# Patient Record
Sex: Female | Born: 1935 | Race: White | Hispanic: No | Marital: Married | Smoking: Former smoker
Health system: Southern US, Community
[De-identification: ages and names within clinical notes are randomized; demographics above are authoritative.]

## PROBLEM LIST (undated history)

## (undated) DIAGNOSIS — I10 Essential (primary) hypertension: Secondary | ICD-10-CM

## (undated) DIAGNOSIS — E785 Hyperlipidemia, unspecified: Secondary | ICD-10-CM

## (undated) DIAGNOSIS — I219 Acute myocardial infarction, unspecified: Secondary | ICD-10-CM

## (undated) HISTORY — PX: CARDIAC CATHETERIZATION: SHX172

## (undated) HISTORY — PX: CORONARY ANGIOPLASTY: SHX604

---

## 1993-02-25 HISTORY — PX: CORONARY STENT PLACEMENT: SHX1402

## 2000-09-03 ENCOUNTER — Inpatient Hospital Stay (HOSPITAL_COMMUNITY)
Admission: RE | Admit: 2000-09-03 | Discharge: 2000-09-16 | Payer: Self-pay | Admitting: Physical Medicine & Rehabilitation

## 2004-08-21 ENCOUNTER — Ambulatory Visit: Payer: Self-pay | Admitting: Ophthalmology

## 2004-08-27 ENCOUNTER — Ambulatory Visit: Payer: Self-pay | Admitting: Ophthalmology

## 2011-06-09 ENCOUNTER — Emergency Department: Payer: Self-pay | Admitting: Emergency Medicine

## 2011-06-09 LAB — CBC
MCH: 30.5 pg (ref 26.0–34.0)
MCV: 94 fL (ref 80–100)
Platelet: 275 10*3/uL (ref 150–440)
RDW: 13.7 % (ref 11.5–14.5)

## 2011-06-09 LAB — COMPREHENSIVE METABOLIC PANEL
Alkaline Phosphatase: 56 U/L (ref 50–136)
Anion Gap: 14 (ref 7–16)
EGFR (African American): 37 — ABNORMAL LOW
Osmolality: 290 (ref 275–301)
SGOT(AST): 36 U/L (ref 15–37)
SGPT (ALT): 17 U/L
Total Protein: 7.7 g/dL (ref 6.4–8.2)

## 2011-06-13 ENCOUNTER — Emergency Department: Payer: Self-pay | Admitting: Emergency Medicine

## 2011-06-13 LAB — COMPREHENSIVE METABOLIC PANEL
Anion Gap: 10 (ref 7–16)
Bilirubin,Total: 0.6 mg/dL (ref 0.2–1.0)
Chloride: 104 mmol/L (ref 98–107)
Co2: 26 mmol/L (ref 21–32)
Creatinine: 1.51 mg/dL — ABNORMAL HIGH (ref 0.60–1.30)
EGFR (African American): 36 — ABNORMAL LOW
EGFR (Non-African Amer.): 31 — ABNORMAL LOW
Osmolality: 284 (ref 275–301)
SGPT (ALT): 21 U/L
Sodium: 140 mmol/L (ref 136–145)

## 2011-06-13 LAB — CBC WITH DIFFERENTIAL/PLATELET
Basophil #: 0.1 10*3/uL (ref 0.0–0.1)
Eosinophil #: 0 10*3/uL (ref 0.0–0.7)
Eosinophil %: 0.2 %
HCT: 36.3 % (ref 35.0–47.0)
Monocyte %: 11.9 %
Platelet: 287 10*3/uL (ref 150–440)
RBC: 3.81 10*6/uL (ref 3.80–5.20)

## 2011-06-15 ENCOUNTER — Inpatient Hospital Stay: Payer: Self-pay | Admitting: Internal Medicine

## 2011-06-15 LAB — BASIC METABOLIC PANEL
Calcium, Total: 9.5 mg/dL (ref 8.5–10.1)
Chloride: 102 mmol/L (ref 98–107)
Co2: 21 mmol/L (ref 21–32)
Osmolality: 279 (ref 275–301)
Potassium: 3.4 mmol/L — ABNORMAL LOW (ref 3.5–5.1)

## 2011-06-15 LAB — CBC
MCH: 30.2 pg (ref 26.0–34.0)
MCHC: 32 g/dL (ref 32.0–36.0)
Platelet: 280 10*3/uL (ref 150–440)
RBC: 3.63 10*6/uL — ABNORMAL LOW (ref 3.80–5.20)
RDW: 13.5 % (ref 11.5–14.5)
WBC: 10.8 10*3/uL (ref 3.6–11.0)

## 2011-06-15 LAB — TROPONIN I
Troponin-I: 2.3 ng/mL — ABNORMAL HIGH
Troponin-I: 1.4 ng/mL — ABNORMAL HIGH

## 2011-06-15 LAB — TSH: Thyroid Stimulating Horm: 2.79 u[IU]/mL

## 2011-06-15 LAB — CK TOTAL AND CKMB (NOT AT ARMC): CK, Total: 102 U/L (ref 21–215)

## 2011-06-16 LAB — COMPREHENSIVE METABOLIC PANEL
Albumin: 2.8 g/dL — ABNORMAL LOW (ref 3.4–5.0)
Alkaline Phosphatase: 57 U/L (ref 50–136)
Anion Gap: 10 (ref 7–16)
Calcium, Total: 8.7 mg/dL (ref 8.5–10.1)
EGFR (African American): 31 — ABNORMAL LOW
Glucose: 92 mg/dL (ref 65–99)
Osmolality: 279 (ref 275–301)
SGOT(AST): 35 U/L (ref 15–37)
SGPT (ALT): 14 U/L
Sodium: 139 mmol/L (ref 136–145)
Total Protein: 6.5 g/dL (ref 6.4–8.2)

## 2011-06-16 LAB — CBC WITH DIFFERENTIAL/PLATELET
Basophil %: 0.6 %
Eosinophil %: 0.7 %
HGB: 9.9 g/dL — ABNORMAL LOW (ref 12.0–16.0)
Lymphocyte #: 1.3 10*3/uL (ref 1.0–3.6)
Lymphocyte %: 13.7 %
MCH: 29.8 pg (ref 26.0–34.0)
MCHC: 31.3 g/dL — ABNORMAL LOW (ref 32.0–36.0)
MCV: 95 fL (ref 80–100)
Neutrophil #: 6.8 10*3/uL — ABNORMAL HIGH (ref 1.4–6.5)
Neutrophil %: 74.4 %
Platelet: 302 10*3/uL (ref 150–440)
RBC: 3.34 10*6/uL — ABNORMAL LOW (ref 3.80–5.20)
RDW: 13.2 % (ref 11.5–14.5)
WBC: 9.2 10*3/uL (ref 3.6–11.0)

## 2011-06-17 LAB — BASIC METABOLIC PANEL
Anion Gap: 9 (ref 7–16)
BUN: 15 mg/dL (ref 7–18)
Calcium, Total: 8.6 mg/dL (ref 8.5–10.1)
Co2: 21 mmol/L (ref 21–32)
EGFR (African American): 37 — ABNORMAL LOW
Glucose: 94 mg/dL (ref 65–99)
Osmolality: 282 (ref 275–301)

## 2011-07-01 ENCOUNTER — Encounter (HOSPITAL_COMMUNITY): Payer: Self-pay | Admitting: *Deleted

## 2011-07-01 ENCOUNTER — Emergency Department (HOSPITAL_COMMUNITY): Payer: Medicare Other

## 2011-07-01 ENCOUNTER — Observation Stay (HOSPITAL_COMMUNITY)
Admission: EM | Admit: 2011-07-01 | Discharge: 2011-07-04 | Disposition: A | Discharge status: Home / Self Care | Payer: Medicare Other | Attending: Internal Medicine | Admitting: Internal Medicine

## 2011-07-01 DIAGNOSIS — G459 Transient cerebral ischemic attack, unspecified: Secondary | ICD-10-CM

## 2011-07-01 DIAGNOSIS — E782 Mixed hyperlipidemia: Secondary | ICD-10-CM

## 2011-07-01 DIAGNOSIS — N289 Disorder of kidney and ureter, unspecified: Secondary | ICD-10-CM | POA: Insufficient documentation

## 2011-07-01 DIAGNOSIS — F05 Delirium due to known physiological condition: Secondary | ICD-10-CM | POA: Insufficient documentation

## 2011-07-01 DIAGNOSIS — Z8669 Personal history of other diseases of the nervous system and sense organs: Secondary | ICD-10-CM

## 2011-07-01 DIAGNOSIS — I10 Essential (primary) hypertension: Secondary | ICD-10-CM | POA: Diagnosis present

## 2011-07-01 DIAGNOSIS — F0390 Unspecified dementia without behavioral disturbance: Secondary | ICD-10-CM | POA: Insufficient documentation

## 2011-07-01 DIAGNOSIS — I252 Old myocardial infarction: Secondary | ICD-10-CM | POA: Insufficient documentation

## 2011-07-01 DIAGNOSIS — R4789 Other speech disturbances: Principal | ICD-10-CM | POA: Insufficient documentation

## 2011-07-01 DIAGNOSIS — M216X9 Other acquired deformities of unspecified foot: Secondary | ICD-10-CM | POA: Insufficient documentation

## 2011-07-01 DIAGNOSIS — G5790 Unspecified mononeuropathy of unspecified lower limb: Secondary | ICD-10-CM | POA: Insufficient documentation

## 2011-07-01 DIAGNOSIS — E785 Hyperlipidemia, unspecified: Secondary | ICD-10-CM | POA: Diagnosis present

## 2011-07-01 DIAGNOSIS — R4781 Slurred speech: Secondary | ICD-10-CM

## 2011-07-01 DIAGNOSIS — I2510 Atherosclerotic heart disease of native coronary artery without angina pectoris: Secondary | ICD-10-CM | POA: Diagnosis present

## 2011-07-01 DIAGNOSIS — E876 Hypokalemia: Secondary | ICD-10-CM | POA: Insufficient documentation

## 2011-07-01 DIAGNOSIS — R4701 Aphasia: Secondary | ICD-10-CM

## 2011-07-01 DIAGNOSIS — E860 Dehydration: Secondary | ICD-10-CM | POA: Insufficient documentation

## 2011-07-01 HISTORY — DX: Acute myocardial infarction, unspecified: I21.9

## 2011-07-01 HISTORY — DX: Essential (primary) hypertension: I10

## 2011-07-01 HISTORY — DX: Hyperlipidemia, unspecified: E78.5

## 2011-07-01 LAB — PROTIME-INR
INR: 0.94 (ref 0.00–1.49)
Prothrombin Time: 12.8 seconds (ref 11.6–15.2)

## 2011-07-01 LAB — BASIC METABOLIC PANEL
GFR calc Af Amer: 30 mL/min — ABNORMAL LOW (ref >90)
GFR calc non Af Amer: 26 mL/min — ABNORMAL LOW (ref >90)
Potassium: 3.3 mEq/L — ABNORMAL LOW (ref 3.5–5.1)
Sodium: 137 mEq/L (ref 135–145)

## 2011-07-01 LAB — DIFFERENTIAL
Basophils Absolute: 0 10*3/uL (ref 0.0–0.1)
Basophils Relative: 0 % (ref 0–1)
Eosinophils Absolute: 0.1 10*3/uL (ref 0.0–0.7)
Lymphs Abs: 2.3 10*3/uL (ref 0.7–4.0)
Neutrophils Relative %: 60 % (ref 43–77)

## 2011-07-01 LAB — CBC
MCH: 30.5 pg (ref 26.0–34.0)
MCHC: 32.8 g/dL (ref 30.0–36.0)
Platelets: 301 10*3/uL (ref 150–400)
RBC: 3.7 MIL/uL — ABNORMAL LOW (ref 3.87–5.11)

## 2011-07-01 LAB — APTT: aPTT: 29 seconds (ref 24–37)

## 2011-07-01 MED ORDER — SODIUM CHLORIDE 0.9 % IV SOLN
INTRAVENOUS | Status: DC
  Administered 2011-07-02: 75 mL/h via INTRAVENOUS

## 2011-07-01 MED ORDER — ACETAMINOPHEN 325 MG PO TABS: 650.0000 mg | ORAL_TABLET | ORAL | Status: DC | PRN

## 2011-07-01 MED ORDER — POTASSIUM CHLORIDE CRYS ER 20 MEQ PO TBCR
20.0000 meq | EXTENDED_RELEASE_TABLET | Freq: Two times a day (BID) | ORAL | Status: DC
  Administered 2011-07-02 – 2011-07-03 (×3): 20 meq via ORAL
  Filled 2011-07-01 (×5): qty 1

## 2011-07-01 MED ORDER — SODIUM CHLORIDE 0.9 % IV SOLN
INTRAVENOUS | Status: AC
  Administered 2011-07-01: 125 mL/h via INTRAVENOUS

## 2011-07-01 MED ORDER — SIMVASTATIN 40 MG PO TABS
40.0000 mg | ORAL_TABLET | Freq: Every evening | ORAL | Status: DC
Start: 2011-07-02 — End: 2011-07-04
  Administered 2011-07-02 – 2011-07-03 (×2): 40 mg via ORAL
  Filled 2011-07-01 (×3): qty 1

## 2011-07-01 MED ORDER — PAROXETINE HCL 20 MG PO TABS
20.0000 mg | ORAL_TABLET | Freq: Every day | ORAL | Status: DC
Start: 2011-07-02 — End: 2011-07-04
  Administered 2011-07-02 – 2011-07-04 (×3): 20 mg via ORAL
  Filled 2011-07-01 (×3): qty 1

## 2011-07-01 MED ORDER — SODIUM CHLORIDE 0.9 % IV SOLN
INTRAVENOUS | Status: DC
  Administered 2011-07-01: 21:00:00 via INTRAVENOUS

## 2011-07-01 MED ORDER — ASPIRIN 325 MG PO TABS
325.0000 mg | ORAL_TABLET | Freq: Every day | ORAL | Status: DC
  Administered 2011-07-02: 325 mg via ORAL
  Filled 2011-07-01: qty 1

## 2011-07-01 MED ORDER — METOPROLOL SUCCINATE ER 50 MG PO TB24
50.0000 mg | ORAL_TABLET | Freq: Every day | ORAL | Status: DC
Start: 2011-07-02 — End: 2011-07-04
  Administered 2011-07-02 – 2011-07-04 (×3): 50 mg via ORAL
  Filled 2011-07-01 (×3): qty 1

## 2011-07-01 NOTE — Code Documentation (Signed)
78 yo female started to have stuttered/slurred speech and odd hand gestures noted by patient's husband at 1800, patient stated she had felt funny all day. EMS arrived and only slurred/stuttered speech was present, EMS noted that this resolved at 1845. Code stroke called at 1854, patient arrived at 54, EDP exam at 1859, stroke team arrived at 17, LSN per husband was 1800, patient arrived to CT at 1901, phlebotomy arrived at 1901, CT read by Dr. Roseanne Reno read CT at [phone], initial NIH 01, patient unable to state month but no issues with slurred or stuttered speech. CT canceled per Dr. Roseanne Reno at [phone].

## 2011-07-01 NOTE — ED Notes (Signed)
Went with pt to CT scan.  [place], RN from stroke team will chart NIH and stroke log.

## 2011-07-01 NOTE — ED Notes (Addendum)
Pt from home.  pts husband noticed that pt was grasping objects and having slurred speech at 1800 today.  Pt had slurred speech when EMS arrived. Speech cleared at 1845.  No other deficits.  No droop or drift noted.  Pt denies pain.  Vitals stable.  20ga (L) wrist, 20ga LAC.  CBG 106

## 2011-07-01 NOTE — ED Provider Notes (Signed)
History     CSN: [id number]  Arrival date & time 07/01/11  1859   First MD Initiated Contact with Patient 07/01/11 1906      Chief Complaint  Patient presents with  . Code Stroke    (Consider location/radiation/quality/duration/timing/severity/associated sxs/prior treatment) HPI  88yoF h/o HTN, HLD, CAD presents with difficulty talking. Her family approximately 6:10 PM patient began to have expressive aphasia. She had minimal slurred speech at that time. It lasted for 30-40 minutes. The patient states that she feels back to baseline the emergency department. Her family agrees. She denies numbness, tingling, weakness of her extremity. She denies head trauma. Her husband states that she was having some difficulty grasping objects earlier today but patient denies.  ED Notes, ED Provider Notes from 07/01/11 0000 to 07/01/11 19:20:23       Katelyn Irwin Brakeman, RN 07/01/2011 19:17      Went with pt to CT scan. [place], RN from stroke team will chart NIH and stroke log.         Felipa Evener Haw[place], [place] 07/01/2011 19:16      Pt from home. pts husband noticed that pt was grasping objects and having slurred speech at 1800 today. Pt had slurred speech when EMS arrived. Speech cleared at 1845. No other deficits. No droop or drift noted. Pt denies pain. Vitals stable. 20ga (L) wrist, 20ga LAC. CBG 106                 Original note by Vilma Meckel, RN at 07/01/2011 19:15         Katelyn Irwin Brakeman, RN 07/01/2011 19:15      Pt from home. pts husband noticed that pt was grasping objects and having slurred speech at 1800 today. Pt had slurred speech when EMS arrived. Speech cleared at 1845. No other deficits. No droop or drift noted. Pt denies pain. Vitals stable. 20ga (L) wrist, 20ga LAC.     Past Medical History  Diagnosis Date  . Hypertension   . Hyperlipemia   . Myocardial infarction     Past Surgical History  Procedure Date  . Coronary stent placement 1995  .  Cardiac catheterization     stent placed  . Coronary angioplasty     History reviewed. No pertinent family history.  History  Substance Use Topics  . Smoking status: Former Games developer  . Smokeless tobacco: Not on file  . Alcohol Use: 0.6 oz/week    1 Glasses of wine per week     5 oz a night    OB History    Grav Para Term Preterm Abortions TAB SAB Ect Mult Living                  Review of Systems  All other systems reviewed and are negative.   except as noted HPI   Allergies  Penicillins  Home Medications  No current outpatient prescriptions on file.  BP 162/73  Pulse 68  Temp(Src) 98.1 F (36.7 C) (Oral)  Resp 20  Ht 5\' 6"  (1.676 m)  Wt 118 lb 2.7 oz (53.6 kg)  BMI 19.07 kg/m2  SpO2 97%  Physical Exam  Nursing note and vitals reviewed. Constitutional: She is oriented to person, place, and time. She appears well-developed.  HENT:  Head: Atraumatic.  Mouth/Throat: Oropharynx is clear and moist.  Eyes: Conjunctivae and EOM are normal. Pupils are equal, round, and reactive to light.  Neck: Normal range of motion. Neck supple.  Cardiovascular: Normal rate,  regular rhythm, normal heart sounds and intact distal pulses.   Pulmonary/Chest: Effort normal and breath sounds normal. No respiratory distress. She has no wheezes. She has no rales.  Abdominal: Soft. She exhibits no distension. There is no tenderness. There is no rebound and no guarding.  Musculoskeletal: Normal range of motion.  Neurological: She is alert and oriented to person, place, and time. No cranial nerve deficit. She exhibits normal muscle tone. Coordination normal.       Strength 5/5 all extremities No pronator drift No facial droop   Skin: Skin is warm and dry. No rash noted.  Psychiatric: She has a normal mood and affect.    Date: 07/02/2011  Rate: 68  Rhythm: normal sinus rhythm  QRS Axis: normal  Intervals: normal  ST/T Wave abnormalities: nonspecific ST changes  Conduction  Disutrbances:nonspecific intraventricular conduction delay  Narrative Interpretation:   Old EKG Reviewed: none available per ED staff.  ED Course  Procedures (including critical care time)  Labs Reviewed  CBC - Abnormal; Notable for the following:    RBC 3.70 (*)    Hemoglobin 11.3 (*)    HCT 34.4 (*)    All other components within normal limits  BASIC METABOLIC PANEL - Abnormal; Notable for the following:    Potassium 3.3 (*)    Glucose, Bld 114 (*)    Creatinine, Ser 1.69 (*)    GFR calc non Af Amer 26 (*)    GFR calc Af Amer 30 (*)    All other components within normal limits  DIFFERENTIAL  APTT  PROTIME-INR  GLUCOSE, CAPILLARY  COMPREHENSIVE METABOLIC PANEL  CBC  TSH  GLUCOSE, POCT (MANUAL RESULT ENTRY)  URINE RAPID DRUG SCREEN (HOSP PERFORMED)  HEMOGLOBIN A1C  LIPID PANEL  GLUCOSE, POCT (MANUAL RESULT ENTRY)  GLUCOSE, POCT (MANUAL RESULT ENTRY)  GLUCOSE, POCT (MANUAL RESULT ENTRY)   Ct Head Wo Contrast  07/01/2011  *RADIOLOGY REPORT*  Clinical Data: Code stroke.  Slurred speech.  CT HEAD WITHOUT CONTRAST  Technique:  Contiguous axial images were obtained from the base of the skull through the vertex without contrast.  Comparison: None.  Findings: Study is degraded by patient motion despite repeated imaging.  Within the motion artifact, no acute hemorrhage, hydrocephalus, mass lesion, abnormal extra-axial fluid collection is evident.  There is no CT evidence for acute ischemia.  Diffuse loss of parenchymal volume is consistent with atrophy. Patchy low attenuation in the deep hemispheric and periventricular white matter is nonspecific, but likely reflects chronic microvascular ischemic demyelination.  The visualized paranasal sinuses and mastoid air cells are clear.  IMPRESSION: No CT evidence for acute intracranial abnormality although study is degraded by patient motion.  Atrophy with advanced chronic small vessel ischemic white matter demyelination.  I personally called the  results of the study directly to Dr. Bettey Costa, covering for Dr. Roseanne Reno at this time, at 1917 hours on 07/01/2011.  Original Report Authenticated By: ERIC A. MANSELL, M.D.   1. Expressive aphasia   2. Slurred speech   Renal insufficiency  MDM  Transient expressive aphasia/slurred speech. Resolved. She is not a tpa candidate. CODE STROKE cancelled. See neurology note for full details. Discussed admission with [facility]. She will be admitted for further w/u and evaluation.         Forbes Cellar, MD 07/02/11 [phone]

## 2011-07-01 NOTE — H&P (Signed)
[name] is an 78 y.o. female.   PCP - Dr.Hande in [place]. Chief Complaint: Difficulty speaking. HPI: 78 year-old female with known history of hypertension, CAD status post stenting in 1995, hyperlipidemia and neuropathy with foot drop for which patient had received immunoglobulin in 2002 at [facility] was brought to the ER as patient was noticed to have slurred speech with confusion. As per patient's husband patient's symptoms were noticed around 6 PM and lasted for around 10 minutes and the symptoms completely resolved. Patient did not have any focal deficits or did not lose consciousness. Patient did not have any visual symptoms. In the ER patient had a CT head and initially a code stroke was called. Which was canceled later by the neurologist. At this time patient has been admitted for TIA workup. Patient denies any chest pain shortness of breath nausea vomiting abdominal pain. Presently she has no difficulty speaking.  Past Medical History  Diagnosis Date  . Hypertension   . Hyperlipemia   . Myocardial infarction     Past Surgical History  Procedure Date  . Coronary stent placement 1995  . Cardiac catheterization     stent placed  . Coronary angioplasty     History reviewed. No pertinent family history. Social History:  reports that she has quit smoking. She does not have any smokeless tobacco history on file. She reports that she drinks about .6 ounces of alcohol per week. Her drug history not on file.  Allergies:  Allergies  Allergen Reactions  . Penicillins Rash    Medications Prior to Admission  Medication Sig Dispense Refill  . hydrochlorothiazide (HYDRODIURIL) 25 MG tablet Take 25 mg by mouth daily.      . metoprolol succinate (TOPROL-XL) 25 MG 24 hr tablet Take 50 mg by mouth daily.      Marland Kitchen PARoxetine (PAXIL) 20 MG tablet Take 20 mg by mouth every morning.      . simvastatin (ZOCOR) 40 MG tablet Take 40 mg by mouth every evening.        Results for orders placed  during the hospital encounter of 07/01/11 (from the past 48 hour(s))  CBC     Status: Abnormal   Collection Time   07/01/11  7:04 PM      Component Value Range Comment   WBC 7.3  4.0 - 10.5 (K/uL)    RBC 3.70 (*) 3.87 - 5.11 (MIL/uL)    Hemoglobin 11.3 (*) 12.0 - 15.0 (g/dL)    HCT 40.9 (*) 81.1 - 46.0 (%)    MCV 93.0  78.0 - 100.0 (fL)    MCH 30.5  26.0 - 34.0 (pg)    MCHC 32.8  30.0 - 36.0 (g/dL)    RDW 91.4  78.2 - 95.6 (%)    Platelets 301  150 - 400 (K/uL)   DIFFERENTIAL     Status: Normal   Collection Time   07/01/11  7:04 PM      Component Value Range Comment   Neutrophils Relative 60  43 - 77 (%)    Neutro Abs 4.4  1.7 - 7.7 (K/uL)    Lymphocytes Relative 31  12 - 46 (%)    Lymphs Abs 2.3  0.7 - 4.0 (K/uL)    Monocytes Relative 7  3 - 12 (%)    Monocytes Absolute 0.5  0.1 - 1.0 (K/uL)    Eosinophils Relative 1  0 - 5 (%)    Eosinophils Absolute 0.1  0.0 - 0.7 (K/uL)  Basophils Relative 0  0 - 1 (%)    Basophils Absolute 0.0  0.0 - 0.1 (K/uL)   BASIC METABOLIC PANEL     Status: Abnormal   Collection Time   07/01/11  7:04 PM      Component Value Range Comment   Sodium 137  135 - 145 (mEq/L)    Potassium 3.3 (*) 3.5 - 5.1 (mEq/L)    Chloride 99  96 - 112 (mEq/L)    CO2 20  19 - 32 (mEq/L)    Glucose, Bld 114 (*) 70 - 99 (mg/dL)    BUN 20  6 - 23 (mg/dL)    Creatinine, Ser 1.61 (*) 0.50 - 1.10 (mg/dL)    Calcium 09.6  8.4 - 10.5 (mg/dL)    GFR calc non Af Amer 26 (*) >90 (mL/min)    GFR calc Af Amer 30 (*) >90 (mL/min)   APTT     Status: Normal   Collection Time   07/01/11  7:04 PM      Component Value Range Comment   aPTT 29  24 - 37 (seconds)   PROTIME-INR     Status: Normal   Collection Time   07/01/11  7:04 PM      Component Value Range Comment   Prothrombin Time 12.8  11.6 - 15.2 (seconds)    INR 0.94  0.00 - 1.49    GLUCOSE, CAPILLARY     Status: Normal   Collection Time   07/01/11  8:27 PM      Component Value Range Comment   Glucose-Capillary 99  70 - 99  (mg/dL)    Comment 1 Notify RN      Ct Head Wo Contrast  07/01/2011  *RADIOLOGY REPORT*  Clinical Data: Code stroke.  Slurred speech.  CT HEAD WITHOUT CONTRAST  Technique:  Contiguous axial images were obtained from the base of the skull through the vertex without contrast.  Comparison: None.  Findings: Study is degraded by patient motion despite repeated imaging.  Within the motion artifact, no acute hemorrhage, hydrocephalus, mass lesion, abnormal extra-axial fluid collection is evident.  There is no CT evidence for acute ischemia.  Diffuse loss of parenchymal volume is consistent with atrophy. Patchy low attenuation in the deep hemispheric and periventricular white matter is nonspecific, but likely reflects chronic microvascular ischemic demyelination.  The visualized paranasal sinuses and mastoid air cells are clear.  IMPRESSION: No CT evidence for acute intracranial abnormality although study is degraded by patient motion.  Atrophy with advanced chronic small vessel ischemic white matter demyelination.  I personally called the results of the study directly to Dr. Bettey Costa, covering for Dr. Roseanne Reno at this time, at 1917 hours on 07/01/2011.  Original Report Authenticated By: ERIC A. MANSELL, M.D.    Review of Systems  Constitutional: Negative.   HENT: Negative.   Eyes: Negative.   Respiratory: Negative.   Cardiovascular: Negative.   Gastrointestinal: Negative.   Genitourinary: Negative.   Musculoskeletal: Negative.   Skin: Negative.   Neurological: Positive for speech change.       Confusion.  Endo/Heme/Allergies: Negative.   Psychiatric/Behavioral: Negative.     Blood pressure 162/73, pulse 68, temperature 98.1 F (36.7 C), temperature source Oral, resp. rate 20, height 5\' 6"  (1.676 m), weight 53.6 kg (118 lb 2.7 oz), SpO2 97.00%. Physical Exam  Constitutional: She is oriented to person, place, and time. She appears well-developed and well-nourished. No distress.  HENT:  Head:  Normocephalic and atraumatic.  Right Ear: External ear  normal.  Left Ear: External ear normal.  Mouth/Throat: No oropharyngeal exudate.  Eyes: Conjunctivae are normal. Right eye exhibits no discharge. Left eye exhibits no discharge. No scleral icterus.  Neck: Normal range of motion. Neck supple.  Cardiovascular: Normal rate, regular rhythm and normal heart sounds.   Respiratory: Effort normal and breath sounds normal. No respiratory distress. She has no wheezes. She has no rales.  GI: Soft. Bowel sounds are normal. She exhibits no distension. There is no tenderness. There is no rebound.  Musculoskeletal: Normal range of motion. She exhibits no edema and no tenderness.  Neurological: She is alert and oriented to person, place, and time.       Moves all extremities 5/5. No facial asymmetry.  Skin: Skin is warm and dry. No rash noted. She is not diaphoretic. No erythema.  Psychiatric: Her behavior is normal.     Assessment/Plan #1. Transient episode of difficulty speaking and confusion possibly TIA - place patient on neuro checks, swallow evaluation. Get MRI/MRA brain, carotid Doppler and 2-D echo. #2. History of CAD status post stenting - presently has no chest pain. Continue home medications. #3. History of neuropathy with foot drop status post IV immunoglobulin in 2002. #4. History of hypertension and hyperlipidemia - continue present medications. #5. Mild hypokalemia - replace and recheck.  CODE STATUS - DO NOT RESUSCITATE as confirmed with patient's husband.  Jeneal Vogl N. 07/01/2011, 11:40 PM

## 2011-07-01 NOTE — Consult Note (Signed)
Chief Complaint: Transient speech output difficulty.  HPI: [name] is an 78 y.o. female a history of hypertension, coronary artery disease and hyperlipidemia, presenting with onset of speech output difficulty at 1800 today. No focal weakness was noted. No facial asymmetry was noted. There was no apparent dysarthria. The previous history of stroke or TIA. She's been on aspirin 81 mg per day. CT scan of her head showed no acute intracranial abnormality. Patient's speech abnormality resolved after arriving in the emergency room. NIH stroke score initially was 2. Following improvement NIH stroke score was 0. Patient was not given intravenous thrombolytic therapy with TPA, and code stroke status was canceled.  LSN: 1800 on 07/01/2011 tPA Given: No: Rapid improvement in symptoms MRankin: 0  No past medical history on file.  No family history on file.   Medications: Prior to Admission:  Hydrochlorothiazide 25 mg per day Toprol-XL 50 mg per day Paxil 20 mg per day Zocor 40 mg per day Aspirin 81 mg per day  Physical Examination: Blood pressure 143/61, pulse 73, temperature 97.5 F (36.4 C), temperature source Oral, resp. rate 18, SpO2 99.00%.  Neurologic Examination: Mental Status: Alert, oriented, thought content appropriate.  Speech fluent without evidence of aphasia. Able to follow commands. Cranial Nerves: II-Visual fields were normal. III/IV/VI-Pupils were equal and reacted. Extraocular movements were full and conjugate.    V/VII-no facial numbness and no facial weakness. VIII-normal. X-normal speech. XII-midline tongue extension Motor: Bilateral weakness of dorsiflexors of the feet (3/5), as well as mild to moderate intrinsic hand muscle weakness bilaterally with wasting, right greater than left. Motor exam is otherwise unremarkable. Deep Tendon Reflexes: 2+ and symmetric except for absent ankle reflexes bilaterally. Plantars: Flexor bilaterally Cerebellar: Normal  finger-to-nose testing. Carotid auscultation: Normal   Ct Head Wo Contrast  07/01/2011  *RADIOLOGY REPORT*  Clinical Data: Code stroke.  Slurred speech.  CT HEAD WITHOUT CONTRAST  Technique:  Contiguous axial images were obtained from the base of the skull through the vertex without contrast.  Comparison: None.  Findings: Study is degraded by patient motion despite repeated imaging.  Within the motion artifact, no acute hemorrhage, hydrocephalus, mass lesion, abnormal extra-axial fluid collection is evident.  There is no CT evidence for acute ischemia.  Diffuse loss of parenchymal volume is consistent with atrophy. Patchy low attenuation in the deep hemispheric and periventricular white matter is nonspecific, but likely reflects chronic microvascular ischemic demyelination.  The visualized paranasal sinuses and mastoid air cells are clear.  IMPRESSION: No CT evidence for acute intracranial abnormality although study is degraded by patient motion.  Atrophy with advanced chronic small vessel ischemic white matter demyelination.  I personally called the results of the study directly to Dr. Bettey Costa, covering for Dr. Roseanne Reno at this time, at 1917 hours on 07/01/2011.  Original Report Authenticated By: ERIC A. MANSELL, M.D.    Assessment:   1. Probable transient ischemic attack, most likely subcortical involvement left MCA territory with transient expressive aphasia. 2. Chronic peripheral neuropathy with bilateral foot drop and bilateral intrinsic hand muscle weakness and wasting. Patient has been evaluated and is being followed by a neurologist in [facility].  Stroke Risk Factors - hyperlipidemia, hypertension.  Plan: 1. HgbA1c, fasting lipid panel 2. MRI, MRA  of the brain without contrast 3. PT consult, OT consult, Speech consult 4. Echocardiogram 5. Carotid dopplers 6. Prophylactic therapy-Antiplatelet med: Plavix 75 mg per day 7. Risk factor modification 8. Telemetry  monitoring  C.R. Roseanne Reno, MD [facility] [phone]  07/01/2011, 8:06  PM

## 2011-07-02 ENCOUNTER — Observation Stay (HOSPITAL_COMMUNITY): Payer: Medicare Other

## 2011-07-02 DIAGNOSIS — I2510 Atherosclerotic heart disease of native coronary artery without angina pectoris: Secondary | ICD-10-CM

## 2011-07-02 DIAGNOSIS — G459 Transient cerebral ischemic attack, unspecified: Secondary | ICD-10-CM

## 2011-07-02 DIAGNOSIS — E782 Mixed hyperlipidemia: Secondary | ICD-10-CM

## 2011-07-02 DIAGNOSIS — I10 Essential (primary) hypertension: Secondary | ICD-10-CM

## 2011-07-02 LAB — COMPREHENSIVE METABOLIC PANEL
Albumin: 3.1 g/dL — ABNORMAL LOW (ref 3.5–5.2)
Alkaline Phosphatase: 56 U/L (ref 39–117)
BUN: 20 mg/dL (ref 6–23)
Calcium: 9.4 mg/dL (ref 8.4–10.5)
Potassium: 4.7 mEq/L (ref 3.5–5.1)
Total Protein: 6.4 g/dL (ref 6.0–8.3)

## 2011-07-02 LAB — CBC
HCT: 31.7 % — ABNORMAL LOW (ref 36.0–46.0)
Hemoglobin: 10.1 g/dL — ABNORMAL LOW (ref 12.0–15.0)
MCV: 93.5 fL (ref 78.0–100.0)
RBC: 3.39 MIL/uL — ABNORMAL LOW (ref 3.87–5.11)
RDW: 13.3 % (ref 11.5–15.5)
WBC: 7 10*3/uL (ref 4.0–10.5)

## 2011-07-02 LAB — POCT I-STAT, CHEM 8
Chloride: 105 mEq/L (ref 96–112)
Glucose, Bld: 117 mg/dL — ABNORMAL HIGH (ref 70–99)
HCT: 35 % — ABNORMAL LOW (ref 36.0–46.0)
Hemoglobin: 11.9 g/dL — ABNORMAL LOW (ref 12.0–15.0)
Potassium: 3.3 mEq/L — ABNORMAL LOW (ref 3.5–5.1)
Sodium: 139 mEq/L (ref 135–145)

## 2011-07-02 LAB — RAPID URINE DRUG SCREEN, HOSP PERFORMED
Amphetamines: NOT DETECTED
Barbiturates: NOT DETECTED
Benzodiazepines: NOT DETECTED
Cocaine: NOT DETECTED
Opiates: NOT DETECTED
Tetrahydrocannabinol: NOT DETECTED

## 2011-07-02 LAB — LIPID PANEL
Cholesterol: 173 mg/dL (ref 0–200)
LDL Cholesterol: 102 mg/dL — ABNORMAL HIGH (ref 0–99)
Total CHOL/HDL Ratio: 3.5 RATIO
VLDL: 22 mg/dL (ref 0–40)

## 2011-07-02 MED ORDER — CLOPIDOGREL BISULFATE 75 MG PO TABS
75.0000 mg | ORAL_TABLET | Freq: Every day | ORAL | Status: DC
  Administered 2011-07-03 – 2011-07-04 (×2): 75 mg via ORAL
  Filled 2011-07-02 (×2): qty 1

## 2011-07-02 NOTE — Progress Notes (Signed)
  Echocardiogram 2D Echocardiogram has been performed.  [name] [facility] 07/02/2011, 10:08 AM

## 2011-07-02 NOTE — Progress Notes (Signed)
RN called by radiology with results of MRI scan. MD Suanne Marker and MD Pearlean Brownie paged with results. No orders at this time.   Minor, [name]

## 2011-07-02 NOTE — Progress Notes (Signed)
Subjective: Pt denies any c/o, states foot drop is unchanged-longstanding. Objective: Vital signs in last 24 hours: Temp:  [97.5 F (36.4 C)-98.7 F (37.1 C)] 98.3 F (36.8 C) (05/07 1423) Pulse Rate:  [64-75] 73  (05/07 1423) Resp:  [17-20] 20  (05/07 1423) BP: (131-162)/(61-74) 131/69 mmHg (05/07 1423) SpO2:  [94 %-100 %] 97 % (05/07 1423) Weight:  [53.6 kg (118 lb 2.7 oz)] 53.6 kg (118 lb 2.7 oz) (05/06 2310)   Intake/Output from previous day: 05/06 0701 - 05/07 0700 In: 462.5 [I.V.:462.5] Out: -  Intake/Output this shift: Total I/O In: 360 [P.O.:360] Out: -     General Appearance:    Alert, cooperative, no distress, appears stated age  Lungs:     Clear to auscultation bilaterally, respirations unlabored   Heart:    Regular rate and rhythm, S1 and S2 normal, no murmur, rub   or gallop  Abdomen:     Soft, non-tender, bowel sounds present,    no masses, no organomegaly  Extremities:    no cyanosis or edema  Neurologic:   CNII-XII intact, decreased dorsiflexion of feet bil., sensory grossly intact      Weight change:   Intake/Output Summary (Last 24 hours) at 07/02/11 1727 Last data filed at 07/02/11 0847  Gross per 24 hour  Intake  822.5 ml  Output      0 ml  Net  822.5 ml    Lab Results:   Basename 07/02/11 0505 07/01/11 1913 07/01/11 1904  NA 138 139 --  K 4.7 3.3* --  CL 105 105 --  CO2 24 -- 20  GLUCOSE 101* 117* --  BUN 20 20 --  CREATININE 1.74* 1.90* --  CALCIUM 9.4 -- 10.1    Basename 07/02/11 0505 07/01/11 1913 07/01/11 1904  WBC 7.0 -- 7.3  HGB 10.1* 11.9* --  HCT 31.7* 35.0* --  PLT 280 -- 301  MCV 93.5 -- 93.0   PT/INR  Basename 07/01/11 1904  LABPROT 12.8  INR 0.94   ABG No results found for this basename: PHART:2,PCO2:2,PO2:2,HCO3:2 in the last 72 hours  Micro Results: No results found for this or any previous visit (from the past 240 hour(s)). Studies/Results: Ct Head Wo Contrast  07/01/2011  *RADIOLOGY REPORT*  Clinical  Data: Code stroke.  Slurred speech.  CT HEAD WITHOUT CONTRAST  Technique:  Contiguous axial images were obtained from the base of the skull through the vertex without contrast.  Comparison: None.  Findings: Study is degraded by patient motion despite repeated imaging.  Within the motion artifact, no acute hemorrhage, hydrocephalus, mass lesion, abnormal extra-axial fluid collection is evident.  There is no CT evidence for acute ischemia.  Diffuse loss of parenchymal volume is consistent with atrophy. Patchy low attenuation in the deep hemispheric and periventricular white matter is nonspecific, but likely reflects chronic microvascular ischemic demyelination.  The visualized paranasal sinuses and mastoid air cells are clear.  IMPRESSION: No CT evidence for acute intracranial abnormality although study is degraded by patient motion.  Atrophy with advanced chronic small vessel ischemic white matter demyelination.  I personally called the results of the study directly to Dr. Bettey Costa, covering for Dr. Roseanne Reno at this time, at 1917 hours on 07/01/2011.  Original Report Authenticated By: ERIC A. MANSELL, M.D.   rd call feature.  Original Report Authenticated By: Fuller Canada, M.D.   Medications:  Scheduled Meds:   . sodium chloride   Intravenous STAT  . aspirin  325 mg Oral Daily  .  metoprolol succinate  50 mg Oral Daily  . PARoxetine  20 mg Oral Daily  . potassium chloride  20 mEq Oral BID  . simvastatin  40 mg Oral QPM   Continuous Infusions:   . sodium chloride 75 mL/hr at 07/02/11 0700  . DISCONTD: sodium chloride 100 mL/hr at 07/01/11 2057   PRN Meds:.acetaminophen Assessment/Plan: 1. Transient episode of difficulty speaking and confusion/ ?TIA  -F/U MRI/MRA brain, carotid Doppler and 2-D echo, also obtain EEG as per neuro. -change to plavix as per Neuro for CVA prophylaxis,continue zocor -appreciate neuro assistance 2. History of CAD status post stenting  - chest pain free.  -Continue  outpt medications.  3. History of neuropathy with foot drop status post IV immunoglobulin in 2002 at [facility].  4. History of hypertension and hyperlipidemia  - continue current medications.  5. Mild hypokalemia - resolved.  6. Renal Insufficiency -  -Improving,follow- recheck and further manage accordingly  CODE STATUS - DO NOT RESUSCITATE  LOS: 1 day   Kammy Klett C 07/02/2011, 5:27 PM

## 2011-07-02 NOTE — Progress Notes (Signed)
Utilization review complete 

## 2011-07-02 NOTE — Progress Notes (Signed)
*  PRELIMINARY RESULTS* Vascular Ultrasound Carotid Duplex (Doppler) has been completed.   No evidence of internal carotid artery stenosis bilaterally. Bilateral antegrade vertebral artery flow.  Malachy Moan, RDMS, RDCS 07/02/2011, 11:46 AM

## 2011-07-02 NOTE — Progress Notes (Signed)
History: 78 year-old female with known history of hypertension, CAD status post stenting in 1995, hyperlipidemia and neuropathy with foot drop for which patient had received immunoglobulin in 2002 at [facility] was brought to the ER as patient was noticed to have slurred speech with confusion. As per patient's husband patient's symptoms were noticed around 6 PM and lasted for around 10 minutes and the symptoms completely resolved. Patient did not have any focal deficits or did not lose consciousness. Patient did not have any visual symptoms. In the ER patient had a CT head and initially a code stroke was called. Which was canceled later by the neurologist. At this time patient has been admitted for TIA workup. Patient denies any chest pain shortness of breath nausea vomiting abdominal pain. Presently she has no difficulty speaking.  LSN:1800 07/01/11 tPA Given: no, sx resolved completely.  Subjective: Does not recall having slurred speech or confusion yesterday.  Does have chronic leg weakness and neuropathy which has not gotten worse. Has foot drop bilateral for which she wears braces to walk.  She does admit that her husband thinks she is generally confused a bit. And has mild memory impairment.  Objective: BP 156/65  Pulse 75  Temp(Src) 98.7 F (37.1 C) (Oral)  Resp 20  Ht 5\' 6"  (1.676 m)  Wt 53.6 kg (118 lb 2.7 oz)  BMI 19.07 kg/m2  SpO2 97%  CBGs  Basename 07/01/11 2027  GLUCAP 99    Diet: regular  Activity: up with assistance  DVT Prophylaxis: SCDs  Medications: Scheduled:   . sodium chloride   Intravenous STAT  . aspirin  325 mg Oral Daily  . metoprolol succinate  50 mg Oral Daily  . PARoxetine  20 mg Oral Daily  . potassium chloride  20 mEq Oral BID  . simvastatin  40 mg Oral QPM    Neurologic Exam: Mental Status: Alert, oriented to self., thought content generally appropriate. Confused about details leading to hospitalization yesterday.  Speech fluent without evidence of  aphasia. Able to follow 3 step commands without difficulty.Recall 1/3. Animal Naming 7 only. Adding coins: incorrect, naming animals #7/min, 3 word recall: 1, prompted recall 0 Cranial Nerves: II- Visual fields grossly intact. III/IV/VI-Extraocular movements intact.  Pupils reactive bilaterally. V/VII-Smile symmetric VIII-hearing grossly intact (HOH) IX/X-normal gag XI-bilateral shoulder shrug XII-midline tongue extension Motor: 5/5 bilaterally with normal tone and bulk; foot drop left foot Sensory: Light touch intact throughout, bilaterally Deep Tendon Reflexes: 2+ and symmetric throughout Plantars: Downgoing bilaterally Cerebellar: Normal finger-to-nose, normal rapid alternating movements and normal heel-to-shin test.   Lab Results: Basic Metabolic Panel:  Lab 07/02/11 1191 07/01/11 1913 07/01/11 1904  NA 138 139 --  K 4.7 3.3* --  CL 105 105 --  CO2 24 -- 20  GLUCOSE 101* 117* --  BUN 20 20 --  CREATININE 1.74* 1.90* --  CALCIUM 9.4 -- 10.1  MG -- -- --  PHOS -- -- --   Liver Function Tests:  Lab 07/02/11 0505  AST 29  ALT 14  ALKPHOS 56  BILITOT 0.2*  PROT 6.4  ALBUMIN 3.1*   CBC:  Lab 07/02/11 0505 07/01/11 1913 07/01/11 1904  WBC 7.0 -- 7.3  NEUTROABS -- -- 4.4  HGB 10.1* 11.9* --  HCT 31.7* 35.0* --  MCV 93.5 -- 93.0  PLT 280 -- 301   CBG:  Lab 07/01/11 2027  GLUCAP 99   Fasting Lipid Panel:  Lab 07/02/11 0505  CHOL 173  HDL 49  LDLCALC 102*  TRIG 111  CHOLHDL 3.5  LDLDIRECT --   Coagulation:  Lab 07/01/11 1904  LABPROT 12.8  INR 0.94   Urine Drug Screen: Drugs of Abuse     Component Value Date/Time   LABOPIA NONE DETECTED 07/02/2011 0815   COCAINSCRNUR NONE DETECTED 07/02/2011 0815   LABBENZ NONE DETECTED 07/02/2011 0815   AMPHETMU NONE DETECTED 07/02/2011 0815   THCU NONE DETECTED 07/02/2011 0815   LABBARB NONE DETECTED 07/02/2011 0815     Study Results:  07/01/2011   CT HEAD WITHOUT CONTRAST   Findings: Study is degraded by patient  motion despite repeated imaging.  Within the motion artifact, no acute hemorrhage, hydrocephalus, mass lesion, abnormal extra-axial fluid collection is evident.  There is no CT evidence for acute ischemia.  Diffuse loss of parenchymal volume is consistent with atrophy. Patchy low attenuation in the deep hemispheric and periventricular white matter is nonspecific, but likely reflects chronic microvascular ischemic demyelination.  The visualized paranasal sinuses and mastoid air cells are clear.  IMPRESSION: No CT evidence for acute intracranial abnormality although study is degraded by patient motion.  Atrophy with advanced chronic small vessel ischemic white matter demyelination. ERIC A. MANSELL, M.D.   Therapies:pending  Assessment/Plan: 1.Transient  confusion and dysarthria essentially resolved.-etiology unclear- TIA vs SZ, vs dehydration/infection and baseline mild dementia.  Head CT negative for acute process. 2. Chronic peripheral neuropathy with bilateral foot drop and bilateral intrinsic hand muscle weakness and wasting. Patient has been evaluated and is being followed by a neurologist in [facility].  3. Dementia- mild at baseline. 4. Hyperlipidemia- LDL102; Goal < 100.  Stroke Risk Factors - hyperlipidemia, hypertension.   Plan:  1. HgbA1c 2. PT consult, OT consult, Speech consult  3. Echocardiogram  4. Carotid dopplers  5. Prophylactic therapy-Antiplatelet med: Plavix 75 mg per day  6. Risk factor modification  7. Telemetry monitoring 8. MRI/MRA    9.EEG  LOS: 1 day   Jana Hakim [facility] [phone] 07/02/2011  10:03 AM  Patient seen and examined by Dr. Pearlean Brownie, who agrees with above and examined patient and developed plan of care.

## 2011-07-03 ENCOUNTER — Observation Stay (HOSPITAL_COMMUNITY): Payer: Medicare Other

## 2011-07-03 DIAGNOSIS — I10 Essential (primary) hypertension: Secondary | ICD-10-CM

## 2011-07-03 DIAGNOSIS — I2510 Atherosclerotic heart disease of native coronary artery without angina pectoris: Secondary | ICD-10-CM

## 2011-07-03 DIAGNOSIS — E782 Mixed hyperlipidemia: Secondary | ICD-10-CM

## 2011-07-03 DIAGNOSIS — G459 Transient cerebral ischemic attack, unspecified: Secondary | ICD-10-CM

## 2011-07-03 LAB — BASIC METABOLIC PANEL
Chloride: 108 mEq/L (ref 96–112)
Creatinine, Ser: 1.46 mg/dL — ABNORMAL HIGH (ref 0.50–1.10)
GFR calc Af Amer: 36 mL/min — ABNORMAL LOW (ref >90)
Sodium: 140 mEq/L (ref 135–145)

## 2011-07-03 NOTE — Progress Notes (Signed)
Patient ID: [name]  female  ZOX:[id number]    DOB: March 03, 1923    DOA: 07/01/2011  PCP: No primary provider on file.  Subjective: No slurring of speech noticed, no confusion, no new complaints  Objective: Weight change:  No intake or output data in the 24 hours ending 07/03/11 1506 Blood pressure 129/64, pulse 54, temperature 98.1 F (36.7 C), temperature source Oral, resp. rate 18, height 5\' 6"  (1.676 m), weight 53.6 kg (118 lb 2.7 oz), SpO2 99.00%.  Physical Exam: General: Alert and awake, oriented x3, not in any acute distress. HEENT: anicteric sclera, pupils reactive to light and accommodation, EOMI CVS: S1-S2 clear, no murmur rubs or gallops Chest: clear to auscultation bilaterally, no wheezing, rales or rhonchi Abdomen: soft nontender, nondistended, normal bowel sounds, no organomegaly Extremities: no cyanosis, clubbing or edema noted bilaterally Neuro: Cranial nerves II-XII intact, no focal neurological deficits  Lab Results: Basic Metabolic Panel:  Lab 07/03/11 8119 07/02/11 0505  NA 140 138  K 4.1 4.7  CL 108 105  CO2 24 24  GLUCOSE 98 101*  BUN 15 20  CREATININE 1.46* 1.74*  CALCIUM 9.1 9.4  MG -- --  PHOS -- --   Liver Function Tests:  Lab 07/02/11 0505  AST 29  ALT 14  ALKPHOS 56  BILITOT 0.2*  PROT 6.4  ALBUMIN 3.1*  CBC:  Lab 07/02/11 0505 07/01/11 1913 07/01/11 1904  WBC 7.0 -- 7.3  NEUTROABS -- -- 4.4  HGB 10.1* 11.9* --  HCT 31.7* 35.0* --  MCV 93.5 -- 93.0  PLT 280 -- 301   CBG:  Lab 07/01/11 2027  GLUCAP 99      Studies/Results: Ct Head Wo Contrast  07/01/2011  *RADIOLOGY REPORT*  Clinical Data: Code stroke.  Slurred speech.  CT HEAD WITHOUT CONTRAST  Technique:  Contiguous axial images were obtained from the base of the skull through the vertex without contrast.  Comparison: None.  Findings: Study is degraded by patient motion despite repeated imaging.  Within the motion artifact, no acute hemorrhage, hydrocephalus, mass lesion,  abnormal extra-axial fluid collection is evident.  There is no CT evidence for acute ischemia.  Diffuse loss of parenchymal volume is consistent with atrophy. Patchy low attenuation in the deep hemispheric and periventricular white matter is nonspecific, but likely reflects chronic microvascular ischemic demyelination.  The visualized paranasal sinuses and mastoid air cells are clear.  IMPRESSION: No CT evidence for acute intracranial abnormality although study is degraded by patient motion.  Atrophy with advanced chronic small vessel ischemic white matter demyelination.  I personally called the results of the study directly to Dr. Bettey Costa, covering for Dr. Roseanne Reno at this time, at 1917 hours on 07/01/2011.  Original Report Authenticated By: ERIC A. MANSELL, M.D.   Mri Brain Without Contrast  07/02/2011  *RADIOLOGY REPORT*  Clinical Data:  Slurred speech.  Confusion.  High blood pressure. Hyperlipidemia.  MRI HEAD WITHOUT CONTRAST MRA HEAD WITHOUT CONTRAST  Technique: Multiplanar, multiecho pulse sequences of the brain and surrounding structures were obtained according to standard protocol without intravenous contrast.  Angiographic images of the head were obtained using MRA technique without contrast.  Comparison: 07/01/2011 CT.  No comparison MR.  MRI HEAD  Findings:  The patient did not allow full FLAIR sequence or T1 axial to be performed. Some of the sequences are motion degraded.  No acute infarct.  No intracranial hemorrhage.  Global atrophy without hydrocephalus.  Prominent small vessel disease type changes.  No intracranial mass lesion detected on  this unenhanced exam.  Cervical spondylotic changes with spinal stenosis most notable C5-6 where there is mild cord compression.  IMPRESSION: No acute infarct.  Please see above.  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  1.2 mm aneurysm right internal carotid artery cavernous segment directed laterally.  Mild irregularity and narrowing  without high-grade stenosis of the cavernous and supraclinoid segment of the internal carotid artery bilaterally.  Moderate middle cerebral artery and A2 segment anterior cerebral arteries branch vessel irregularity bilaterally.  Ectatic vertebral arteries and basilar artery without high-grade stenosis.  Mild narrowing distal right vertebral artery and proximal to mid basilar artery.  Nonvisualization right PICA and both AICAs.  Mild posterior cerebral artery branch vessel irregularity bilaterally.  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  1.2 mm aneurysm right internal carotid artery cavernous segment directed laterally.  Intracranial atherosclerotic type changes as detailed above.  This has been made a PRA call report utilizing dashboard call feature.  Original Report Authenticated By: Fuller Canada, M.D.   Mr Mra Head/brain Wo Cm  07/02/2011  *RADIOLOGY REPORT*  Clinical Data:  Slurred speech.  Confusion.  High blood pressure. Hyperlipidemia.  MRI HEAD WITHOUT CONTRAST MRA HEAD WITHOUT CONTRAST  Technique: Multiplanar, multiecho pulse sequences of the brain and surrounding structures were obtained according to standard protocol without intravenous contrast.  Angiographic images of the head were obtained using MRA technique without contrast.  Comparison: 07/01/2011 CT.  No comparison MR.  MRI HEAD  Findings:  The patient did not allow full FLAIR sequence or T1 axial to be performed. Some of the sequences are motion degraded.  No acute infarct.  No intracranial hemorrhage.  Global atrophy without hydrocephalus.  Prominent small vessel disease type changes.  No intracranial mass lesion detected on this unenhanced exam.  Cervical spondylotic changes with spinal stenosis most notable C5-6 where there is mild cord compression.  IMPRESSION: No acute infarct.  Please see above.  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  1.2 mm aneurysm right internal carotid artery  cavernous segment directed laterally.  Mild irregularity and narrowing without high-grade stenosis of the cavernous and supraclinoid segment of the internal carotid artery bilaterally.  Moderate middle cerebral artery and A2 segment anterior cerebral arteries branch vessel irregularity bilaterally.  Ectatic vertebral arteries and basilar artery without high-grade stenosis.  Mild narrowing distal right vertebral artery and proximal to mid basilar artery.  Nonvisualization right PICA and both AICAs.  Mild posterior cerebral artery branch vessel irregularity bilaterally.  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  1.2 mm aneurysm right internal carotid artery cavernous segment directed laterally.  Intracranial atherosclerotic type changes as detailed above.  This has been made a PRA call report utilizing dashboard call feature.  Original Report Authenticated By: Fuller Canada, M.D.    Medications: Scheduled Meds:   . clopidogrel  75 mg Oral Q breakfast  . metoprolol succinate  50 mg Oral Daily  . PARoxetine  20 mg Oral Daily  . potassium chloride  20 mEq Oral BID  . simvastatin  40 mg Oral QPM  . DISCONTD: aspirin  325 mg Oral Daily   Continuous Infusions:   . sodium chloride 75 mL/hr at 07/02/11 0700     Assessment/Plan: Principal Problem:  *Slurred speech/TIA: Etiology unclear ? Atypical seizure versus dehydration, with mild dementia - Head CT and MRI negative for any acute infarct - Continue Plavix, risk factor modification, PT/OT eval ordered - EEG done today, results however  pending, needs to be read, , discussed with Dr. Elita Boone (he will discuss with Dr. Ferne Coe tonight)  Active Problems:  HTN (hypertension): Continue Toprol-XL    Hyperlipidemia: Continue Zocor   CAD (coronary artery disease) - 2-D echo done, EF of 40-45% with moderate hypokinesis of anterior myocardium and focal akinesis to dyskinesis of the apical lateral wall no definitive thrombus, grade 1  diastolic dysfunction - Creatinine 1.46, (was 1.74 yesterday), hold off on ACE inhibitor due to acute renal insufficiency  Mild acute renal insufficiency: Creatinine improving, continue gentle hydration   H/O neuropathy: Stable, weight PT OT evaluation  Hypokalemia: Resolved, DC scheduled potassium replacements  DVT Prophylaxis:SCDs  Code Status:DNR  Disposition:Likely in a.m. awaiting EEG results   LOS: 2 days   Natascha Edmonds M.D. [facility] 07/03/2011, 3:06 PM Pager: [phone]

## 2011-07-03 NOTE — Progress Notes (Signed)
History: 78 year-old female with known history of hypertension, CAD status post stenting in 1995, hyperlipidemia and neuropathy with foot drop for which patient had received immunoglobulin in 2002 at [facility] was brought to the ER as patient was noticed to have slurred speech with confusion. As per patient's husband patient's symptoms were noticed around 6 PM and lasted for around 10 minutes and the symptoms completely resolved. Patient did not have any focal deficits or did not lose consciousness. Patient did not have any visual symptoms. In the ER patient had a CT head and initially a code stroke was called. Which was canceled later by the neurologist. At this time patient has been admitted for TIA workup. Patient denies any chest pain shortness of breath nausea vomiting abdominal pain. Presently she has no difficulty speaking.  LSN:1800 07/01/11 tPA Given: no, sx resolved completely.  Subjective: Does not recall having slurred speech or confusion yesterday.  Does have chronic leg weakness and neuropathy which has not gotten worse. Has foot drop bilateral for which she wears braces to walk.  She does admit that her husband thinks she is generally confused a bit. And has mild memory impairment.No new complaints today and wants to go home.  Objective: BP 157/69  Pulse 65  Temp(Src) 97.5 F (36.4 C) (Oral)  Resp 16  Ht 5\' 6"  (1.676 m)  Wt 53.6 kg (118 lb 2.7 oz)  BMI 19.07 kg/m2  SpO2 96%  CBGs  Basename 07/01/11 2027  GLUCAP 99    Diet: regular  Activity: up with assistance  DVT Prophylaxis: SCDs  Medications: Scheduled:    . sodium chloride   Intravenous STAT  . clopidogrel  75 mg Oral Q breakfast  . metoprolol succinate  50 mg Oral Daily  . PARoxetine  20 mg Oral Daily  . potassium chloride  20 mEq Oral BID  . simvastatin  40 mg Oral QPM  . DISCONTD: aspirin  325 mg Oral Daily    Neurologic Exam: Mental Status: Alert, oriented to self., thought content generally appropriate.  Confused about details leading to hospitalization yesterday.  Speech fluent without evidence of aphasia. Able to follow 3 step commands without difficulty    Cranial Nerves: II- Visual fields grossly intact. III/IV/VI-Extraocular movements intact.  Pupils reactive bilaterally. V/VII-Smile symmetric VIII-hearing grossly intact (HOH) IX/X-normal gag XI-bilateral shoulder shrug XII-midline tongue extension Motor: 5/5 bilaterally with normal tone and bulk; foot drop left foot Sensory: Light touch intact throughout, bilaterally Deep Tendon Reflexes: 2+ and symmetric throughout Plantars: Downgoing bilaterally Cerebellar: Normal finger-to-nose, normal rapid alternating movements and normal heel-to-shin test.   Lab Results: Basic Metabolic Panel:  Lab 07/03/11 1610 07/02/11 0505  NA 140 138  K 4.1 4.7  CL 108 105  CO2 24 24  GLUCOSE 98 101*  BUN 15 20  CREATININE 1.46* 1.74*  CALCIUM 9.1 9.4  MG -- --  PHOS -- --   Liver Function Tests:  Lab 07/02/11 0505  AST 29  ALT 14  ALKPHOS 56  BILITOT 0.2*  PROT 6.4  ALBUMIN 3.1*   CBC:  Lab 07/02/11 0505 07/01/11 1913 07/01/11 1904  WBC 7.0 -- 7.3  NEUTROABS -- -- 4.4  HGB 10.1* 11.9* --  HCT 31.7* 35.0* --  MCV 93.5 -- 93.0  PLT 280 -- 301   CBG:  Lab 07/01/11 2027  GLUCAP 99   Fasting Lipid Panel:  Lab 07/02/11 0505  CHOL 173  HDL 49  LDLCALC 102*  TRIG 111  CHOLHDL 3.5  LDLDIRECT --  Coagulation:  Lab 07/01/11 1904  LABPROT 12.8  INR 0.94   Urine Drug Screen: Drugs of Abuse     Component Value Date/Time   LABOPIA NONE DETECTED 07/02/2011 0815   COCAINSCRNUR NONE DETECTED 07/02/2011 0815   LABBENZ NONE DETECTED 07/02/2011 0815   AMPHETMU NONE DETECTED 07/02/2011 0815   THCU NONE DETECTED 07/02/2011 0815   LABBARB NONE DETECTED 07/02/2011 0815     Study Results:  07/01/2011   CT HEAD WITHOUT CONTRAST   Findings: Study is degraded by patient motion despite repeated imaging.  Within the motion artifact, no acute  hemorrhage, hydrocephalus, mass lesion, abnormal extra-axial fluid collection is evident.  There is no CT evidence for acute ischemia.  Diffuse loss of parenchymal volume is consistent with atrophy. Patchy low attenuation in the deep hemispheric and periventricular white matter is nonspecific, but likely reflects chronic microvascular ischemic demyelination.  The visualized paranasal sinuses and mastoid air cells are clear.  IMPRESSION: No CT evidence for acute intracranial abnormality although study is degraded by patient motion.  Atrophy with advanced chronic small vessel ischemic white matter demyelination. ERIC A. MANSELL, M.D.    Therapies:pending  Assessment/Plan: 1.Transient  confusion and dysarthria essentially resolved.-etiology unclear- TIA vs SZ, vs dehydration/infection and baseline mild dementia.  Head CT and MRI negative for acute process. 2. Chronic peripheral neuropathy with bilateral foot drop and bilateral intrinsic hand muscle weakness and wasting. Patient has been evaluated and is being followed by a neurologist in [facility].  3. Dementia- mild at baseline. 4. Hyperlipidemia- LDL102; Goal < 100. 5. NSVT, decreased EF with HK/AK  Stroke Risk Factors - hyperlipidemia, hypertension.   Plan:  1. HgbA1c 2. PT consult, OT consult, Speech consult  3. Prophylactic therapy-Antiplatelet med: Plavix 75 mg per day  4. Risk factor modification  5. Telemetry monitoring 6 .EEG- done, report pending;  7 Stroke Team.will sign off.  Call with questions. D/w Dr Isidoro Donning, patient and husband        LOS: 2 days   Marya Fossa PA-C [facility] [phone] 07/03/2011  9:29 AM  Patient seen and examined by Dr. Pearlean Brownie, who agrees with above and examined patient and developed plan of care.

## 2011-07-03 NOTE — Progress Notes (Signed)
  At approx 2130, pt had 7-beat run of v-tach.  Pt reports no CP or SOB. Tele strips placed in shadow chart. Will continue to monitor.  Daphene Calamity RN

## 2011-07-03 NOTE — Progress Notes (Signed)
Pateint [name], 78 year old white female resident of [place], [place] struggles with several health issues.  Patient thanked Orthoptist for providing pastoral presence, prayer, and conversation.  No follow-up needed.

## 2011-07-03 NOTE — Progress Notes (Signed)
Routine EEG completed.  

## 2011-07-04 DIAGNOSIS — I2510 Atherosclerotic heart disease of native coronary artery without angina pectoris: Secondary | ICD-10-CM

## 2011-07-04 DIAGNOSIS — I10 Essential (primary) hypertension: Secondary | ICD-10-CM

## 2011-07-04 DIAGNOSIS — E782 Mixed hyperlipidemia: Secondary | ICD-10-CM

## 2011-07-04 DIAGNOSIS — G459 Transient cerebral ischemic attack, unspecified: Secondary | ICD-10-CM

## 2011-07-04 LAB — BASIC METABOLIC PANEL
BUN: 12 mg/dL (ref 6–23)
CO2: 23 mEq/L (ref 19–32)
Calcium: 8.7 mg/dL (ref 8.4–10.5)
Glucose, Bld: 97 mg/dL (ref 70–99)
Sodium: 140 mEq/L (ref 135–145)

## 2011-07-04 MED ORDER — METOPROLOL SUCCINATE ER 25 MG PO TB24: 50.0000 mg | ORAL_TABLET | Freq: Every day | ORAL | Status: AC

## 2011-07-04 MED ORDER — CLOPIDOGREL BISULFATE 75 MG PO TABS: 75.0000 mg | ORAL_TABLET | Freq: Every day | ORAL | Status: AC

## 2011-07-04 MED ORDER — METOPROLOL SUCCINATE ER 25 MG PO TB24: 75.0000 mg | ORAL_TABLET | Freq: Every day | ORAL | Status: DC

## 2011-07-04 NOTE — Progress Notes (Signed)
Stroke Team Progress Note  HISTORY 78 year-old female with known history of hypertension, CAD status post stenting in 1995, hyperlipidemia and neuropathy with foot drop for which patient had received immunoglobulin in 2002 at [facility] was brought to the ER 07/01/2011 as patient was noticed to have slurred speech with confusion. As per patient's husband patient's symptoms were noticed around 6 PM and lasted for around 10 minutes and the symptoms completely resolved. Patient did not have any focal deficits or did not lose consciousness. Patient did not have any visual symptoms. In the ER patient had a CT head and initially a code stroke was called. Which was canceled later by the neurologist. At this time patient has been admitted for TIA workup. Patient denies any chest pain shortness of breath nausea vomiting abdominal pain. Presently she has no difficulty speaking. Patient was not a TPA candidate secondary to symptoms resolution. She was admitted for further evaluation and treatment.  SUBJECTIVE Her husband is at the bedside.  Overall she feels her condition is stable. Atttending at bedside; pt and husband making jokes.  OBJECTIVE Most recent Vital Signs: Filed Vitals:   07/03/11 1735 07/03/11 2148 07/04/11 0100 07/04/11 0616  BP: 147/79 151/57 150/52 153/61  Pulse: 60 59 67 66  Temp: 98 F (36.7 C) 98 F (36.7 C) 98 F (36.7 C) 98.2 F (36.8 C)  TempSrc: Oral Oral Oral Oral  Resp: 18 16 18 19   Height:      Weight:      SpO2: 100% 100% 99% 99%   CBG (last 3)   Basename 07/01/11 2027  GLUCAP 99   Intake/Output from previous day: 05/08 0701 - 05/09 0700 In: 1020 [P.O.:120; I.V.:900] Out: 500 [Urine:500]  IV Fluid Intake:     . sodium chloride 75 mL/hr at 07/02/11 0700   MEDICATIONS    . clopidogrel  75 mg Oral Q breakfast  . metoprolol succinate  50 mg Oral Daily  . PARoxetine  20 mg Oral Daily  . simvastatin  40 mg Oral QPM  . DISCONTD: potassium chloride  20 mEq Oral BID   PRN:  acetaminophen  Diet:  Cardiac thin liquids Activity:   Bathroom privileges with assistance DVT Prophylaxis:  SCDs   CLINICALLY SIGNIFICANT STUDIES Basic Metabolic Panel:  Lab 07/04/11 1308 07/03/11 0627  NA 140 140  K 5.0 4.1  CL 110 108  CO2 23 24  GLUCOSE 97 98  BUN 12 15  CREATININE 1.22* 1.46*  CALCIUM 8.7 9.1  MG -- --  PHOS -- --   Liver Function Tests:  Lab 07/02/11 0505  AST 29  ALT 14  ALKPHOS 56  BILITOT 0.2*  PROT 6.4  ALBUMIN 3.1*   CBC:  Lab 07/02/11 0505 07/01/11 1913 07/01/11 1904  WBC 7.0 -- 7.3  NEUTROABS -- -- 4.4  HGB 10.1* 11.9* --  HCT 31.7* 35.0* --  MCV 93.5 -- 93.0  PLT 280 -- 301   Coagulation:  Lab 07/01/11 1904  LABPROT 12.8  INR 0.94   Cardiac Enzymes: No results found for this basename: CKTOTAL:3,CKMB:3,CKMBINDEX:3,TROPONINI:3 in the last 168 hours Urinalysis: No results found for this basename: COLORURINE:2,APPERANCEUR:2,LABSPEC:2,PHURINE:2,GLUCOSEU:2,HGBUR:2,BILIRUBINUR:2,KETONESUR:2,PROTEINUR:2,UROBILINOGEN:2,NITRITE:2,LEUKOCYTESUR:2 in the last 168 hours  Hemoglobin A1C:  Lab 07/02/11 0505  HGBA1C 5.8*   Fasting Lipid Panel:  Lab 07/02/11 0505  CHOL 173  HDL 49  LDLCALC 102*  TRIG 111  CHOLHDL 3.5  LDLDIRECT --   Urine Drug Screen:     Component Value Date/Time   LABOPIA NONE DETECTED 07/02/2011  0815   COCAINSCRNUR NONE DETECTED 07/02/2011 0815   LABBENZ NONE DETECTED 07/02/2011 0815   AMPHETMU NONE DETECTED 07/02/2011 0815   THCU NONE DETECTED 07/02/2011 0815   LABBARB NONE DETECTED 07/02/2011 0815    Alcohol Level: No results found for this basename: ETH:2 in the last 168 hours  Thyroid Function Tests:  Lab 07/02/11 0505  TSH 1.764  T4TOTAL --  FREET4 --  T3FREE --  THYROIDAB --   CT of the brain  07/01/2011 No CT evidence for acute intracranial abnormality although study is degraded by patient motion. Atrophy with advanced chronic small vessel ischemic white matter demyelination.   MRI of the brain  07/02/2011    No acute infarct.    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  1.2 mm aneurysm right internal carotid artery cavernous segment directed laterally.  Intracranial atherosclerotic type changes.  2D Echocardiogram  07/02/2011 EF 40-45% with no source of embolus. Ejection. There is moderate hypokinesis of the anterior myocardium and focal akinesis to dyskinesis of apical lateral wall. A prominent cord is present without definitive thrombus.   Carotid Doppler  No internal carotid artery stenosis bilaterally. Vertebrals with antegrade flow bilaterally.   CXR    EKG  normal sinus rhythm.   EEG This routine EEG done with the patient awake and drowsy is normal.   Therapy Recommendations PT -home health, OT -home health  Physical Exam  Mental Status:  Alert, oriented to self., thought content generally appropriate. Confused about details leading to hospitalization yesterday. Speech fluent without evidence of aphasia. Able to follow 3 step commands without difficulty  Cranial Nerves:  II- Visual fields grossly intact.  III/IV/VI-Extraocular movements intact. Pupils reactive bilaterally.  V/VII-Smile symmetric  VIII-hearing grossly intact (HOH)  IX/X-normal gag  XI-bilateral shoulder shrug  XII-midline tongue extension  Motor: 5/5 bilaterally with normal tone and bulk; foot drop left foot  Sensory: Light touch intact throughout, bilaterally  Deep Tendon Reflexes: 2+ and symmetric throughout  Plantars: Downgoing bilaterally  Cerebellar: Normal finger-to-nose, normal rapid alternating movements and normal heel-to-shin test.   ASSESSMENT [name] is a 78 y.o. female with transiet confusion and speech difficulty, TIA vs SZ vs sequela of baseline dementia. TIA cannot be ruled out. On aspirin 81 mg orally every day prior to admission. Now on clopidogrel 75 mg orally every day for secondary stroke prevention.  -Chronic peripheral neuropathy with bilateral  foot drop and bilateral intrinsic hand muscle weakness and wasting. Patient has been evaluated and is being followed by a neurologist in [facility].  -Dementia- mild at baseline.  -Hyperlipidemia- LDL102; Goal < 100, on zocor 40 -NSVT, decreased EF with HK/AK  Hospital day # 3  TREATMENT/PLAN -Continue clopidogrel 75 mg orally every day for secondary stroke prevention. -agree with plans for discharge today -Stroke Service will sign off. Follow up with Dr. Pearlean Brownie in 2 months.  Joaquin Music, ANP-BC, GNP-BC [facility] Pager: [phone] 07/04/2011 8:25 AM  Scribe for Dr. Delia Heady, Stroke Center Medical Director. He has personally reviewed chart, pertinent data, examined the patient and developed the plan of care. Pager:  [phone]

## 2011-07-04 NOTE — Evaluation (Signed)
Physical Therapy Evaluation Patient Details Name: [name] MRN: [id number] DOB: Aug 27, 1923 Today's Date: 07/04/2011 Time: 4098-1191 PT Time Calculation (min): 29 min  PT Assessment / Plan / Recommendation Clinical Impression  Pt presents with a medical diagnosis of TIA with baseline cognitive deficits and bilateral foot drop. Pt is near baseline for safety although may benefit from additional HHPT and RW for safety upon d/c. Discussed with pt for safety at d/c    PT Assessment  Patent does not need any further PT services    Follow Up Recommendations  Home health PT;Supervision/Assistance - 24 hour    Equipment Recommendations  None recommended by OT       Precautions / Restrictions Precautions Precautions: Fall Restrictions Weight Bearing Restrictions: No         Mobility  Bed Mobility Bed Mobility: Supine to Sit;Sitting - Scoot to Edge of Bed Supine to Sit: 4: Min assist;With rails Sitting - Scoot to Edge of Bed: 5: Supervision Details for Bed Mobility Assistance: VC for sequencing. Min assist through trunk for stability as pt required increased time to complete transfer Transfers Transfers: Sit to Stand;Stand to Sit Sit to Stand: 5: Supervision;With upper extremity assist;From chair/3-in-1 Stand to Sit: 5: Supervision;With upper extremity assist;To chair/3-in-1 Details for Transfer Assistance: VC for hand placement for safety requiring increased cueing for safety throughout. Mod assist for first sit to stand and min assist for remaining stands for safety and stability Ambulation/Gait Ambulation/Gait Assistance: 4: Min assist Ambulation Distance (Feet): 20 Feet Assistive device: Rolling walker Ambulation/Gait Assistance Details: VC for proper sequencing with RW as well as assist for stability and support throughout ambulation. Increased cueing for safety due to bilateral dropfoot, pt able to compensate to avoid foot dragging. Gait Pattern: Step-to pattern;Decreased  dorsiflexion - right;Decreased dorsiflexion - left;Right steppage;Left steppage;Trunk flexed;Narrow base of support Gait velocity: decreased gait speed Modified Rankin (Stroke Patients Only) Modified Rankin: Moderately severe disability      Visit Information  Last PT Received On: 07/04/11 Assistance Needed: +1       Prior Functioning  Home Living Lives With: Spouse Available Help at Discharge: Family Type of Home: House Home Access: Stairs to enter Secretary/administrator of Steps: 3 Entrance Stairs-Rails: None Home Layout: One level Bathroom Shower/Tub: Forensic scientist: Standard Bathroom Accessibility: Yes How Accessible: Accessible via walker Home Adaptive Equipment: Straight cane;Shower chair with back (pt has BIL prafos for foot drop) Prior Function Level of Independence: Independent with assistive device(s) Able to Take Stairs?: Yes Driving: No Vocation: Retired Musician: No difficulties Dominant Hand: Right    Cognition  Overall Cognitive Status: History of cognitive impairments - at baseline Arousal/Alertness: Awake/alert Orientation Level: Appears intact for tasks assessed Behavior During Session: [facility] for tasks performed    Extremity/Trunk Assessment Right Upper Extremity Assessment RUE ROM/Strength/Tone: Within functional levels;WFL for tasks assessed RUE Coordination: WFL - gross/fine motor Left Upper Extremity Assessment LUE ROM/Strength/Tone: Within functional levels LUE Coordination: WFL - gross/fine motor Right Lower Extremity Assessment RLE ROM/Strength/Tone: Deficits RLE ROM/Strength/Tone Deficits: foot drop RLE Sensation: WFL - Light Touch Left Lower Extremity Assessment LLE ROM/Strength/Tone: Deficits LLE ROM/Strength/Tone Deficits: dropfoot LLE Sensation: WFL - Light Touch Trunk Assessment Trunk Assessment: Normal   Balance Balance Balance Assessed: Yes Static Standing Balance Static Standing -  Balance Support: During functional activity;Left upper extremity supported (min guard (A)) Static Standing - Level of Assistance: 5: Stand by assistance Static Standing - Comment/# of Minutes: pt required SBA with static standing and min assist for  dynamic for stability due to balance loss without UE support  End of Session PT - End of Session Equipment Utilized During Treatment: Gait belt Activity Tolerance: Patient tolerated treatment well Patient left: in chair;with call bell/phone within reach Nurse Communication: Mobility status   Milana Kidney 07/04/2011, 9:26 AM  07/04/2011 Milana Kidney DPT PAGER: [phone] OFFICE: [phone]

## 2011-07-04 NOTE — Procedures (Signed)
EEG NUMBER:  [id number].  This routine EEG was requested in this 78 year old female who was admitted with expressive aphasia, confusion, and slurred speech that has now resolved.  The question is whether this may have been related to a seizure.  She is on no anticonvulsant medications.  The EEG was done with the patient awake and drowsy.  During periods of maximal wakefulness, she had a 9 cycle per second posterior dominant rhythm that attenuated with eye opening, was symmetric.  It was of low amplitude, well regulated and well sustained.  Background activities were characterized by frontally dominant beta activities that were symmetric.  Photic stimulation produced symmetric driving response. Hyperventilation was not performed.  The patient did become drowsy as characterized by attenuation of muscle activity as well as attenuation of alpha rhythm.  EKG revealed normal sinus rhythm.  CLINICAL INTERPRETATION:  This routine EEG done with the patient awake and drowsy is normal.          ______________________________ Denton Meek, MD    XB:JYNW D:  07/04/2011 07:10:16  T:  07/04/2011 07:23:59  Job #:  [id number]

## 2011-07-04 NOTE — Care Management Note (Signed)
    Page 1 of 1   07/04/2011     10:32:25 AM   CARE MANAGEMENT NOTE 07/04/2011  Patient:  [name], [name]   Account Number:  [id number]  Date Initiated:  07/02/2011  Documentation initiated by:  [facility]  Subjective/Objective Assessment:   Admitted with slurred speech. Lives with spouse.     Action/Plan:   PT eval- recommending HHPT  Ot eval-recommending HHOT   Anticipated DC Date:  07/04/2011   Anticipated DC Plan:  HOME W HOME HEALTH SERVICES      DC Planning Services  CM consult      Choice offered to / List presented to:  C-3 Spouse        HH arranged  HH-2 PT  HH-3 OT      [facility] agency  [facility]   Status of service:  Completed, signed off Medicare Important Message given?   (If response is "NO", the following Medicare IM given date fields will be blank) Date Medicare IM given:   Date Additional Medicare IM given:    Discharge Disposition:  HOME W HOME HEALTH SERVICES  Per UR Regulation:  Reviewed for med. necessity/level of care/duration of stay  If discussed at Long Length of Stay Meetings, dates discussed:    Comments:  PCP Hande at [facility]  07/04/11 Spoke with patient and her husband about HHC, they chose [facility] from the list for [facility] agencies list. Dava Najjar at [facility] and requested HHPT and HHOT. Jacquelynn Cree RN,

## 2011-07-04 NOTE — Discharge Summary (Signed)
Physician Discharge Summary  Patient ID: [name] MRN: [id number] DOB/AGE: 1923-08-16 78 y.o.  Admit date: 07/01/2011 Discharge date: 07/04/2011  Primary Care Physician:  No primary provider on file.  Discharge Diagnoses:   Transient confusion with speech difficulty: question TIA versus dehydration  .HTN (hypertension) .Hyperlipidemia .CAD (coronary artery disease)  Consults:  Neurology, stroke team   Discharge Medications: Medication List  As of 07/04/2011  9:54 AM   STOP taking these medications         hydrochlorothiazide 25 MG tablet         TAKE these medications         clopidogrel 75 MG tablet   Commonly known as: PLAVIX   Take 1 tablet (75 mg total) by mouth daily with breakfast.      metoprolol succinate 25 MG 24 hr tablet   Commonly known as: TOPROL-XL   Take 2 tablets (50 mg total) by mouth daily.      PARoxetine 20 MG tablet   Commonly known as: PAXIL   Take 20 mg by mouth every morning.      simvastatin 40 MG tablet   Commonly known as: ZOCOR   Take 40 mg by mouth every evening.             Brief H and P: For complete details please refer to admission H and P, but in brief the patient is an 78 year old female with known history of hypertension, CAD status post stenting in 1995, hyperlipidemia and neuropathy with foot drop for which patient had received immunoglobulin in 2002 at [facility] was brought to the ER as patient was noticed to have slurred speech with confusion. As per patient's husband patient's symptoms were noticed around 6 PM and lasted for around 10 minutes and the symptoms completely resolved. Patient did not have any focal deficits or did not lose consciousness. Patient did not have any visual symptoms. She was admitted for further workup.   Hospital Course:   Transient confusion with Slurred speech: Etiology unclear, possibly TIA versus dehydration, with mild dementia.  Head CT and MRI were negative for any acute infarct.  2-D echo was  done, EF of 40-45% with moderate hypokinesis of anterior myocardium and focal akinesis to dyskinesis of the apical lateral wall no definitive thrombus, grade 1 diastolic dysfunction. Carotid Dopplers showed no significant extracranial carotid artery stenosis. Patient also underwent EEG, which was normal. PTOT evaluation was done and recommended home health PT with 24-hour supervision. Patient will continue Plavix (was previously on baby aspirin at home.)  HTN (hypertension): Continue Toprol-XL. HCTZ is currently placed on hold until she follows up with her PCP.   Hyperlipidemia: LDL 102, continue Zocor   CAD (coronary artery disease): Asymptomatic, no chest pain or any cardiac symptoms. 2-D was echo done, EF of 40-45% with moderate hypokinesis of anterior myocardium and focal akinesis to dyskinesis of the apical lateral wall no definitive thrombus, grade 1 diastolic dysfunction. Patient was placed on Plavix, beta blocker and continued on statin. Creatinine 1.22, (was 1.9 at the time of admission), patient was not placed on ACE inhibitor due to acute renal insufficiency   Mild acute renal insufficiency: Creatinine improving, patient received gentle hydration during hospitalization, at the time of discharge creatinine has improved to 1.2 from 1.9 at time of admission.       Day of Discharge BP 153/61  Pulse 66  Temp(Src) 98.2 F (36.8 C) (Oral)  Resp 19  Ht 5\' 6"  (1.676 m)  Wt 53.6 kg (  118 lb 2.7 oz)  BMI 19.07 kg/m2  SpO2 99%  Physical Exam: General: Alert and awake oriented x3 not in any acute distress. HEENT: anicteric sclera, pupils reactive to light and accommodation CVS: S1-S2 clear no murmur rubs or gallops Chest: clear to auscultation bilaterally, no wheezing rales or rhonchi Abdomen: soft nontender, nondistended, normal bowel sounds, no organomegaly Extremities: no cyanosis, clubbing or edema noted bilaterally Neuro: Cranial nerves II-XII intact, no focal neurological  deficits   The results of significant diagnostics from this hospitalization (including imaging, microbiology, ancillary and laboratory) are listed below for reference.    LAB RESULTS: Basic Metabolic Panel:  Lab 07/04/11 4540 07/03/11 0627  NA 140 140  K 5.0 4.1  CL 110 108  CO2 23 24  GLUCOSE 97 98  BUN 12 15  CREATININE 1.22* 1.46*  CALCIUM 8.7 9.1  MG -- --  PHOS -- --   Liver Function Tests:  Lab 07/02/11 0505  AST 29  ALT 14  ALKPHOS 56  BILITOT 0.2*  PROT 6.4  ALBUMIN 3.1*  CBC:  Lab 07/02/11 0505 07/01/11 1913 07/01/11 1904  WBC 7.0 -- 7.3  NEUTROABS -- -- 4.4  HGB 10.1* 11.9* --  HCT 31.7* 35.0* --  MCV 93.5 -- --  PLT 280 -- 301   CBG:  Lab 07/01/11 2027  GLUCAP 99    Significant Diagnostic Studies:  Ct Head Wo Contrast  07/01/2011  *RADIOLOGY REPORT*  Clinical Data: Code stroke.  Slurred speech.  CT HEAD WITHOUT CONTRAST  Technique:  Contiguous axial images were obtained from the base of the skull through the vertex without contrast.  Comparison: None.  Findings: Study is degraded by patient motion despite repeated imaging.  Within the motion artifact, no acute hemorrhage, hydrocephalus, mass lesion, abnormal extra-axial fluid collection is evident.  There is no CT evidence for acute ischemia.  Diffuse loss of parenchymal volume is consistent with atrophy. Patchy low attenuation in the deep hemispheric and periventricular white matter is nonspecific, but likely reflects chronic microvascular ischemic demyelination.  The visualized paranasal sinuses and mastoid air cells are clear.  IMPRESSION: No CT evidence for acute intracranial abnormality although study is degraded by patient motion.  Atrophy with advanced chronic small vessel ischemic white matter demyelination.  I personally called the results of the study directly to Dr. Bettey Costa, covering for Dr. Roseanne Reno at this time, at 1917 hours on 07/01/2011.  Original Report Authenticated By: ERIC A. MANSELL, M.D.    Mri Brain Without Contrast  07/02/2011  *RADIOLOGY REPORT*  Clinical Data:  Slurred speech.  Confusion.  High blood pressure. Hyperlipidemia.  MRI HEAD WITHOUT CONTRAST MRA HEAD WITHOUT CONTRAST  Technique: Multiplanar, multiecho pulse sequences of the brain and surrounding structures were obtained according to standard protocol without intravenous contrast.  Angiographic images of the head were obtained using MRA technique without contrast.  Comparison: 07/01/2011 CT.  No comparison MR.  MRI HEAD  Findings:  The patient did not allow full FLAIR sequence or T1 axial to be performed. Some of the sequences are motion degraded.  No acute infarct.  No intracranial hemorrhage.  Global atrophy without hydrocephalus.  Prominent small vessel disease type changes.  No intracranial mass lesion detected on this unenhanced exam.  Cervical spondylotic changes with spinal stenosis most notable C5-6 where there is mild cord compression.  IMPRESSION: No acute infarct.  Please see above.  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  1.2 mm aneurysm right internal carotid artery cavernous segment  directed laterally.  Mild irregularity and narrowing without high-grade stenosis of the cavernous and supraclinoid segment of the internal carotid artery bilaterally.  Moderate middle cerebral artery and A2 segment anterior cerebral arteries branch vessel irregularity bilaterally.  Ectatic vertebral arteries and basilar artery without high-grade stenosis.  Mild narrowing distal right vertebral artery and proximal to mid basilar artery.  Nonvisualization right PICA and both AICAs.  Mild posterior cerebral artery branch vessel irregularity bilaterally.  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  1.2 mm aneurysm right internal carotid artery cavernous segment directed laterally.  Intracranial atherosclerotic type changes as detailed above.  This has been made a PRA call report utilizing dashboard call  feature.  Original Report Authenticated By: Fuller Canada, M.D.   Mr Mra Head/brain Wo Cm  07/02/2011  *RADIOLOGY REPORT*  Clinical Data:  Slurred speech.  Confusion.  High blood pressure. Hyperlipidemia.  MRI HEAD WITHOUT CONTRAST MRA HEAD WITHOUT CONTRAST  Technique: Multiplanar, multiecho pulse sequences of the brain and surrounding structures were obtained according to standard protocol without intravenous contrast.  Angiographic images of the head were obtained using MRA technique without contrast.  Comparison: 07/01/2011 CT.  No comparison MR.  MRI HEAD  Findings:  The patient did not allow full FLAIR sequence or T1 axial to be performed. Some of the sequences are motion degraded.  No acute infarct.  No intracranial hemorrhage.  Global atrophy without hydrocephalus.  Prominent small vessel disease type changes.  No intracranial mass lesion detected on this unenhanced exam.  Cervical spondylotic changes with spinal stenosis most notable C5-6 where there is mild cord compression.  IMPRESSION: No acute infarct.  Please see above.  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  1.2 mm aneurysm right internal carotid artery cavernous segment directed laterally.  Mild irregularity and narrowing without high-grade stenosis of the cavernous and supraclinoid segment of the internal carotid artery bilaterally.  Moderate middle cerebral artery and A2 segment anterior cerebral arteries branch vessel irregularity bilaterally.  Ectatic vertebral arteries and basilar artery without high-grade stenosis.  Mild narrowing distal right vertebral artery and proximal to mid basilar artery.  Nonvisualization right PICA and both AICAs.  Mild posterior cerebral artery branch vessel irregularity bilaterally.  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  1.2 mm aneurysm right internal carotid artery cavernous segment directed laterally.  Intracranial atherosclerotic type changes as detailed above.   This has been made a PRA call report utilizing dashboard call feature.  Original Report Authenticated By: Fuller Canada, M.D.     Disposition and Follow-up: Discharge Orders    Future Orders Please Complete By Expires   Diet - low sodium heart healthy      Increase activity slowly      Discharge instructions      Comments:   Hold hydrochlorothiazide until you follow-up with your doctor       DISPOSITION: Home with home PT  DIET: Heart healthy diet  ACTIVITY: As tolerated   DISCHARGE FOLLOW-UP Follow-up Information    Follow up with [facility], [place]. Schedule an appointment as soon as possible for a visit in 2 weeks. (for hospital follow-up)    Contact information:   [address] [place] [address] [phone]          Time spent on Discharge: 45 minutes  Signed:  Hayato Guaman M.D. [facility] 07/04/2011, 9:54 AM

## 2011-07-04 NOTE — Discharge Instructions (Signed)
STROKE/TIA DISCHARGE INSTRUCTIONS SMOKING Cigarette smoking nearly doubles your risk of having a stroke & is the single most alterable risk factor  If you smoke or have smoked in the last 12 months, you are advised to quit smoking for your health.  Most of the excess cardiovascular risk related to smoking disappears within a year of stopping.  Ask you doctor about anti-smoking medications  [place] Quit Line: 1-800-QUIT NOW  Free Smoking Cessation Classes [phone]  CHOLESTEROL Know your levels; limit fat & cholesterol in your diet  Lipid Panel     Component Value Date/Time   CHOL 173 07/02/2011 0505   TRIG 111 07/02/2011 0505   HDL 49 07/02/2011 0505   CHOLHDL 3.5 07/02/2011 0505   VLDL 22 07/02/2011 0505   LDLCALC 102* 07/02/2011 0505      Many patients benefit from treatment even if their cholesterol is at goal.  Goal: Total Cholesterol (CHOL) less than 160  Goal:  Triglycerides (TRIG) less than 150  Goal:  HDL greater than 40  Goal:  LDL (LDLCALC) less than 100   BLOOD PRESSURE American Stroke Association blood pressure target is less that 120/80 mm/Hg  Your discharge blood pressure is:  BP: 137/74 mmHg  Monitor your blood pressure  Limit your salt and alcohol intake  Many individuals will require more than one medication for high blood pressure  DIABETES (A1c is a blood sugar average for last 3 months) Goal HGBA1c is under 7% (HBGA1c is blood sugar average for last 3 months)  Diabetes: No known diagnosis of diabetes    No results found for this basename: HGBA1C     Your HGBA1c can be lowered with medications, healthy diet, and exercise.  Check your blood sugar as directed by your physician  Call your physician if you experience unexplained or low blood sugars.  PHYSICAL ACTIVITY/REHABILITATION Goal is 30 minutes at least 4 days per week    Activity:   {STROKE DC ACTIVITIES:22360} Therapies:   {STROKE DC THERAPIES:22361} Return to work:  ***  Activity decreases your risk  of heart attack and stroke and makes your heart stronger.  It helps control your weight and blood pressure; helps you relax and can improve your mood.  Participate in a regular exercise program.  Talk with your doctor about the best form of exercise for you (dancing, walking, swimming, cycling).  DIET/WEIGHT Goal is to maintain a healthy weight  Your discharge diet is: Cardiac *** liquids Your height is:  Height: 5\' 6"  (167.6 cm) Your current weight is: Weight: 53.6 kg (118 lb 2.7 oz) Your Body Mass Index (BMI) is:  BMI (Calculated): 19.1   Following the type of diet specifically designed for you will help prevent another stroke.  Your goal weight range is:  ***  Your goal Body Mass Index (BMI) is 19-24.  Healthy food habits can help reduce 3 risk factors for stroke:  High cholesterol, hypertension, and excess weight.  RESOURCES Stroke/Support Group:  Call [phone]  they meet the 3rd Sunday of the month on the Rehab Unit at [facility], [place] ( no meetings June, July & Aug).  STROKE EDUCATION PROVIDED/REVIEWED AND GIVEN TO PATIENT Stroke warning signs and symptoms How to activate emergency medical system (call 911). Medications prescribed at discharge. Need for follow-up after discharge. Personal risk factors for stroke. Pneumonia vaccine given:   {STROKE DC YES/NO/DATE:22363} Flu vaccine given:   {STROKE DC YES/NO/DATE:22363} My questions have been answered, the writing is legible, and I understand these instructions.  I will adhere to these goals & educational materials that have been provided to me after my discharge from the hospital.

## 2011-07-04 NOTE — Evaluation (Signed)
Occupational Therapy Evaluation Patient Details Name: Giani Winther MRN: [id number] DOB: 30-Apr-1923 Today's Date: 07/04/2011 Time: 4098-1191 OT Time Calculation (min): 28 min  OT Assessment / Plan / Recommendation Clinical Impression  78 yo female presenting with slurred speech which seems to have resolved. Pt able to clearly verbalize needs during OT evalutation. NO acute OT needs however recommend HHOT for d/c planning    OT Assessment  All further OT needs can be met in the next venue of care    Follow Up Recommendations  Home health OT    Equipment Recommendations  None recommended by OT    Frequency      Precautions / Restrictions Precautions Precautions: Fall Restrictions Weight Bearing Restrictions: No   Pertinent Vitals/Pain none    ADL  Eating/Feeding: Simulated;Supervision/safety Where Assessed - Eating/Feeding: Chair Grooming: Performed;Teeth care;Brushing hair;Supervision/safety Where Assessed - Grooming: Standing at sink Upper Body Dressing: Performed;Modified independent Where Assessed - Upper Body Dressing: Sitting, chair;Unsupported Lower Body Dressing: Performed;Modified independent Where Assessed - Lower Body Dressing: Sit to stand from chair Toilet Transfer: Simulated;Supervision/safety Toilet Transfer Method: Proofreader: Raised toilet seat with arms (or 3-in-1 over toilet) Toileting - Clothing Manipulation: Simulated;Supervision/safety Where Assessed - Toileting Clothing Manipulation: Sit to stand from 3-in-1 or toilet Toileting - Hygiene: Simulated;Supervision/safety Where Assessed - Toileting Hygiene: Sit on 3-in-1 or toilet Equipment Used: Rolling walker Ambulation Related to ADLs: Pt ambulates with BIl Prafos and RW. Pt will have husband (A) 24/ 7 for d/c . Spouse present and agreeable ADL Comments: Pt completed dressing in prep for d/c home. Pt is adequate level for d/c home    OT Goals    Visit Information  Last  OT Received On: 07/04/11 Assistance Needed: +1    Subjective Data  Subjective: "I look like myself except for this hair" Patient Stated Goal: to go home with husband   Prior Functioning  Home Living Lives With: Spouse Available Help at Discharge: Family Type of Home: House Home Access: Stairs to enter Secretary/administrator of Steps: 3 Entrance Stairs-Rails: None Home Layout: One level Bathroom Shower/Tub: Forensic scientist: Standard Bathroom Accessibility: Yes How Accessible: Accessible via walker Home Adaptive Equipment: Straight cane;Shower chair with back (pt has BIL prafos for foot drop) Prior Function Level of Independence: Independent with assistive device(s) Able to Take Stairs?: Yes Driving: No Vocation: Retired Musician: No difficulties Dominant Hand: Right    Cognition  Overall Cognitive Status: History of cognitive impairments - at baseline Arousal/Alertness: Awake/alert Orientation Level: Appears intact for tasks assessed Behavior During Session: [facility] for tasks performed    Extremity/Trunk Assessment Right Upper Extremity Assessment RUE ROM/Strength/Tone: Within functional levels;WFL for tasks assessed RUE Coordination: WFL - gross/fine motor Left Upper Extremity Assessment LUE ROM/Strength/Tone: Within functional levels LUE Coordination: WFL - gross/fine motor Right Lower Extremity Assessment RLE ROM/Strength/Tone: Deficits RLE ROM/Strength/Tone Deficits: foot drop RLE Sensation: WFL - Light Touch Left Lower Extremity Assessment LLE ROM/Strength/Tone: Deficits LLE ROM/Strength/Tone Deficits: dropfoot LLE Sensation: WFL - Light Touch Trunk Assessment Trunk Assessment: Normal   Mobility Bed Mobility Bed Mobility: Supine to Sit;Sitting - Scoot to Edge of Bed Supine to Sit: 4: Min assist;With rails Sitting - Scoot to Edge of Bed: 5: Supervision Details for Bed Mobility Assistance: VC for sequencing. Min assist  through trunk for stability as pt required increased time to complete transfer Transfers Transfers: Sit to Stand;Stand to Sit Sit to Stand: 5: Supervision;With upper extremity assist;From chair/3-in-1 Stand to Sit: 5: Supervision;With upper extremity assist;To chair/3-in-1  Details for Transfer Assistance: VC for hand placement for safety requiring increased cueing for safety throughout. Mod assist for first sit to stand and min assist for remaining stands for safety and stability   Exercise    Balance Balance Balance Assessed: Yes Static Standing Balance Static Standing - Balance Support: During functional activity;Left upper extremity supported (min guard (A))  End of Session OT - End of Session Equipment Utilized During Treatment: Gait belt Activity Tolerance: Patient tolerated treatment well Patient left: in chair;with call bell/phone within reach Nurse Communication: Mobility status   Pt goal today is to leave the hospital and stop by hair dresser to have her fix her hair.  Harrel Carina [facility] 07/04/2011, 9:24 AM Pager: [phone]

## 2011-10-15 LAB — CK TOTAL AND CKMB (NOT AT ARMC)
CK, Total: 74 U/L (ref 21–215)
CK-MB: 1.1 ng/mL (ref 0.5–3.6)

## 2011-10-15 LAB — COMPREHENSIVE METABOLIC PANEL
Albumin: 4.3 g/dL (ref 3.4–5.0)
Alkaline Phosphatase: 67 U/L (ref 50–136)
BUN: 36 mg/dL — ABNORMAL HIGH (ref 7–18)
Bilirubin,Total: 0.4 mg/dL (ref 0.2–1.0)
Chloride: 102 mmol/L (ref 98–107)
Glucose: 111 mg/dL — ABNORMAL HIGH (ref 65–99)
Osmolality: 283 (ref 275–301)
Potassium: 4.8 mmol/L (ref 3.5–5.1)

## 2011-10-15 LAB — CBC
HCT: 37.1 % (ref 35.0–47.0)
HGB: 12.4 g/dL (ref 12.0–16.0)
MCH: 31.7 pg (ref 26.0–34.0)
MCV: 95 fL (ref 80–100)

## 2011-10-15 LAB — URINALYSIS, COMPLETE
Nitrite: NEGATIVE
Ph: 5 (ref 4.5–8.0)
Protein: NEGATIVE
RBC,UR: 2 /HPF (ref 0–5)
WBC UR: 189 /HPF (ref 0–5)

## 2011-10-15 LAB — TROPONIN I: Troponin-I: <0.02 ng/mL

## 2011-10-16 ENCOUNTER — Observation Stay: Payer: Self-pay | Admitting: Internal Medicine

## 2011-10-16 LAB — LIPID PANEL
Cholesterol: 281 mg/dL — ABNORMAL HIGH (ref 0–200)
HDL Cholesterol: 54 mg/dL (ref 40–60)
Ldl Cholesterol, Calc: 201 mg/dL — ABNORMAL HIGH (ref 0–100)
Triglycerides: 130 mg/dL (ref 0–200)
VLDL Cholesterol, Calc: 26 mg/dL (ref 5–40)

## 2011-10-16 LAB — BASIC METABOLIC PANEL
Calcium, Total: 9.4 mg/dL (ref 8.5–10.1)
Co2: 25 mmol/L (ref 21–32)
EGFR (African American): 35 — ABNORMAL LOW
EGFR (Non-African Amer.): 30 — ABNORMAL LOW
Glucose: 97 mg/dL (ref 65–99)
Osmolality: 287 (ref 275–301)

## 2011-10-17 LAB — BASIC METABOLIC PANEL
Anion Gap: 9 (ref 7–16)
BUN: 16 mg/dL (ref 7–18)
Calcium, Total: 9.2 mg/dL (ref 8.5–10.1)
Chloride: 109 mmol/L — ABNORMAL HIGH (ref 98–107)
Co2: 22 mmol/L (ref 21–32)
Creatinine: 1.15 mg/dL (ref 0.60–1.30)
EGFR (African American): 50 — ABNORMAL LOW
EGFR (Non-African Amer.): 43 — ABNORMAL LOW
Glucose: 92 mg/dL (ref 65–99)
Osmolality: 280 (ref 275–301)
Potassium: 3.7 mmol/L (ref 3.5–5.1)
Sodium: 140 mmol/L (ref 136–145)

## 2011-10-17 LAB — URINE CULTURE

## 2011-10-18 DIAGNOSIS — I517 Cardiomegaly: Secondary | ICD-10-CM

## 2011-10-19 LAB — PLATELET COUNT: Platelet: 231 10*3/uL (ref 150–440)

## 2012-04-25 ENCOUNTER — Inpatient Hospital Stay: Payer: Self-pay | Admitting: Internal Medicine

## 2012-04-25 LAB — COMPREHENSIVE METABOLIC PANEL
Chloride: 109 mmol/L — ABNORMAL HIGH (ref 98–107)
Co2: 24 mmol/L (ref 21–32)
Creatinine: 1.41 mg/dL — ABNORMAL HIGH (ref 0.60–1.30)
EGFR (African American): 39 — ABNORMAL LOW
Glucose: 101 mg/dL — ABNORMAL HIGH (ref 65–99)
Potassium: 4 mmol/L (ref 3.5–5.1)
SGPT (ALT): 16 U/L (ref 12–78)
Total Protein: 7.4 g/dL (ref 6.4–8.2)

## 2012-04-25 LAB — CBC WITH DIFFERENTIAL/PLATELET
Basophil #: 0.1 10*3/uL (ref 0.0–0.1)
Basophil %: 0.7 %
Eosinophil #: 0.1 10*3/uL (ref 0.0–0.7)
HCT: 31.5 % — ABNORMAL LOW (ref 35.0–47.0)
HGB: 10.3 g/dL — ABNORMAL LOW (ref 12.0–16.0)
Lymphocyte #: 0.9 10*3/uL — ABNORMAL LOW (ref 1.0–3.6)
MCH: 31.6 pg (ref 26.0–34.0)
MCV: 97 fL (ref 80–100)
Monocyte #: 0.8 x10 3/mm (ref 0.2–0.9)
Monocyte %: 11.6 %
Neutrophil %: 74.3 %
Platelet: 232 10*3/uL (ref 150–440)
RBC: 3.26 10*6/uL — ABNORMAL LOW (ref 3.80–5.20)

## 2012-04-25 LAB — LIPASE, BLOOD: Lipase: 340 U/L (ref 73–393)

## 2012-04-25 LAB — TROPONIN I: Troponin-I: 0.02 ng/mL

## 2012-04-25 LAB — CK TOTAL AND CKMB (NOT AT ARMC)
CK, Total: 73 U/L (ref 21–215)
CK, Total: 75 U/L (ref 21–215)
CK-MB: 1.3 ng/mL (ref 0.5–3.6)
CK-MB: 1.4 ng/mL (ref 0.5–3.6)

## 2012-04-26 LAB — CBC WITH DIFFERENTIAL/PLATELET
Basophil %: 0.9 %
Eosinophil %: 2.1 %
HCT: 29.5 % — ABNORMAL LOW (ref 35.0–47.0)
HGB: 9.5 g/dL — ABNORMAL LOW (ref 12.0–16.0)
Lymphocyte #: 1.1 10*3/uL (ref 1.0–3.6)
Lymphocyte %: 26.6 %
MCH: 31 pg (ref 26.0–34.0)
MCHC: 32.2 g/dL (ref 32.0–36.0)
MCV: 96 fL (ref 80–100)
Monocyte #: 0.6 x10 3/mm (ref 0.2–0.9)
Neutrophil #: 2.4 10*3/uL (ref 1.4–6.5)
Neutrophil %: 56.9 %
RBC: 3.07 10*6/uL — ABNORMAL LOW (ref 3.80–5.20)
RDW: 14.1 % (ref 11.5–14.5)
WBC: 4.2 10*3/uL (ref 3.6–11.0)

## 2012-04-26 LAB — BASIC METABOLIC PANEL
BUN: 19 mg/dL — ABNORMAL HIGH (ref 7–18)
Calcium, Total: 8.6 mg/dL (ref 8.5–10.1)
Co2: 24 mmol/L (ref 21–32)
EGFR (African American): 47 — ABNORMAL LOW
Glucose: 94 mg/dL (ref 65–99)
Osmolality: 283 (ref 275–301)
Potassium: 4.5 mmol/L (ref 3.5–5.1)

## 2012-09-05 ENCOUNTER — Emergency Department: Payer: Self-pay | Admitting: Unknown Physician Specialty

## 2012-09-05 LAB — CBC WITH DIFFERENTIAL/PLATELET
Basophil %: 1.3 %
Eosinophil #: 0.1 10*3/uL (ref 0.0–0.7)
Eosinophil %: 2.7 %
HGB: 10.5 g/dL — ABNORMAL LOW (ref 12.0–16.0)
Lymphocyte %: 23.3 %
MCH: 30.7 pg (ref 26.0–34.0)
MCHC: 32.4 g/dL (ref 32.0–36.0)
MCV: 95 fL (ref 80–100)
Monocyte %: 10.6 %
Neutrophil #: 2.8 10*3/uL (ref 1.4–6.5)
Platelet: 227 10*3/uL (ref 150–440)
RBC: 3.41 10*6/uL — ABNORMAL LOW (ref 3.80–5.20)
WBC: 4.5 10*3/uL (ref 3.6–11.0)

## 2012-09-05 LAB — URINALYSIS, COMPLETE
Bacteria: NONE SEEN
Bilirubin,UR: NEGATIVE
Blood: NEGATIVE
Glucose,UR: NEGATIVE mg/dL (ref 0–75)
Leukocyte Esterase: NEGATIVE
Nitrite: NEGATIVE
Ph: 5 (ref 4.5–8.0)
RBC,UR: NONE SEEN /HPF (ref 0–5)
Specific Gravity: 1.012 (ref 1.003–1.030)
Squamous Epithelial: <1

## 2012-09-05 LAB — COMPREHENSIVE METABOLIC PANEL
Albumin: 3.8 g/dL (ref 3.4–5.0)
Alkaline Phosphatase: 63 U/L (ref 50–136)
Bilirubin,Total: 0.2 mg/dL (ref 0.2–1.0)
Calcium, Total: 9.4 mg/dL (ref 8.5–10.1)
Co2: 23 mmol/L (ref 21–32)
Creatinine: 1.67 mg/dL — ABNORMAL HIGH (ref 0.60–1.30)
Glucose: 89 mg/dL (ref 65–99)
Osmolality: 283 (ref 275–301)
Potassium: 4.3 mmol/L (ref 3.5–5.1)
Sodium: 139 mmol/L (ref 136–145)
Total Protein: 7.6 g/dL (ref 6.4–8.2)

## 2012-09-05 LAB — TROPONIN I: Troponin-I: <0.02 ng/mL

## 2012-09-05 LAB — MAGNESIUM: Magnesium: 1.9 mg/dL

## 2012-11-22 ENCOUNTER — Inpatient Hospital Stay: Payer: Self-pay | Admitting: Family Medicine

## 2012-11-22 LAB — COMPREHENSIVE METABOLIC PANEL
Albumin: 3.6 g/dL (ref 3.4–5.0)
Alkaline Phosphatase: 61 U/L (ref 50–136)
Anion Gap: 7 (ref 7–16)
Bilirubin,Total: 0.3 mg/dL (ref 0.2–1.0)
Calcium, Total: 9.4 mg/dL (ref 8.5–10.1)
Chloride: 106 mmol/L (ref 98–107)
Creatinine: 1.66 mg/dL — ABNORMAL HIGH (ref 0.60–1.30)
EGFR (African American): 32 — ABNORMAL LOW
EGFR (Non-African Amer.): 27 — ABNORMAL LOW
Osmolality: 281 (ref 275–301)
SGOT(AST): 31 U/L (ref 15–37)
SGPT (ALT): 23 U/L (ref 12–78)
Total Protein: 7 g/dL (ref 6.4–8.2)

## 2012-11-22 LAB — CBC
HCT: 32 % — ABNORMAL LOW (ref 35.0–47.0)
MCH: 31.3 pg (ref 26.0–34.0)
MCHC: 32.6 g/dL (ref 32.0–36.0)
MCV: 96 fL (ref 80–100)
Platelet: 227 10*3/uL (ref 150–440)
RDW: 14.2 % (ref 11.5–14.5)
WBC: 6.3 10*3/uL (ref 3.6–11.0)

## 2012-11-22 LAB — TROPONIN I: Troponin-I: <0.02 ng/mL

## 2012-11-22 LAB — PRO B NATRIURETIC PEPTIDE: B-Type Natriuretic Peptide: 11320 pg/mL — ABNORMAL HIGH (ref 0–450)

## 2012-11-23 LAB — CBC WITH DIFFERENTIAL/PLATELET
Eosinophil #: 0.1 10*3/uL (ref 0.0–0.7)
HCT: 31.5 % — ABNORMAL LOW (ref 35.0–47.0)
HGB: 10.5 g/dL — ABNORMAL LOW (ref 12.0–16.0)
Lymphocyte #: 1.1 10*3/uL (ref 1.0–3.6)
Lymphocyte %: 16.8 %
MCH: 31.4 pg (ref 26.0–34.0)
MCHC: 33.3 g/dL (ref 32.0–36.0)
Neutrophil %: 69.5 %
RBC: 3.34 10*6/uL — ABNORMAL LOW (ref 3.80–5.20)
RDW: 14.4 % (ref 11.5–14.5)
WBC: 6.4 10*3/uL (ref 3.6–11.0)

## 2012-11-23 LAB — BASIC METABOLIC PANEL
Calcium, Total: 9.4 mg/dL (ref 8.5–10.1)
Chloride: 105 mmol/L (ref 98–107)
EGFR (Non-African Amer.): 23 — ABNORMAL LOW
Osmolality: 285 (ref 275–301)
Potassium: 3.7 mmol/L (ref 3.5–5.1)
Sodium: 140 mmol/L (ref 136–145)

## 2012-11-23 LAB — MAGNESIUM: Magnesium: 1.5 mg/dL — ABNORMAL LOW

## 2012-11-23 LAB — LIPID PANEL
Cholesterol: 204 mg/dL — ABNORMAL HIGH (ref 0–200)
Triglycerides: 111 mg/dL (ref 0–200)
VLDL Cholesterol, Calc: 22 mg/dL (ref 5–40)

## 2012-11-24 LAB — BASIC METABOLIC PANEL
Anion Gap: 7 (ref 7–16)
BUN: 37 mg/dL — ABNORMAL HIGH (ref 7–18)
Calcium, Total: 9.8 mg/dL (ref 8.5–10.1)
Chloride: 99 mmol/L (ref 98–107)
Co2: 29 mmol/L (ref 21–32)
EGFR (African American): 21 — ABNORMAL LOW
EGFR (Non-African Amer.): 18 — ABNORMAL LOW
Potassium: 3.4 mmol/L — ABNORMAL LOW (ref 3.5–5.1)

## 2012-11-26 ENCOUNTER — Encounter: Payer: Self-pay | Admitting: Internal Medicine

## 2012-11-26 LAB — BASIC METABOLIC PANEL
Anion Gap: 4 — ABNORMAL LOW (ref 7–16)
BUN: 49 mg/dL — ABNORMAL HIGH (ref 7–18)
Calcium, Total: 9.3 mg/dL (ref 8.5–10.1)
Creatinine: 2.91 mg/dL — ABNORMAL HIGH (ref 0.60–1.30)
EGFR (African American): 16 — ABNORMAL LOW
Glucose: 96 mg/dL (ref 65–99)
Osmolality: 290 (ref 275–301)
Potassium: 4.5 mmol/L (ref 3.5–5.1)
Sodium: 139 mmol/L (ref 136–145)

## 2012-11-28 LAB — BASIC METABOLIC PANEL
Anion Gap: 8 (ref 7–16)
BUN: 49 mg/dL — ABNORMAL HIGH (ref 7–18)
Calcium, Total: 9.8 mg/dL (ref 8.5–10.1)
Co2: 25 mmol/L (ref 21–32)
Creatinine: 1.96 mg/dL — ABNORMAL HIGH (ref 0.60–1.30)
EGFR (Non-African Amer.): 22 — ABNORMAL LOW
Osmolality: 292 (ref 275–301)

## 2012-12-01 LAB — BASIC METABOLIC PANEL
Anion Gap: 5 — ABNORMAL LOW (ref 7–16)
Co2: 26 mmol/L (ref 21–32)
Creatinine: 2.68 mg/dL — ABNORMAL HIGH (ref 0.60–1.30)
EGFR (African American): 18 — ABNORMAL LOW
EGFR (Non-African Amer.): 15 — ABNORMAL LOW
Glucose: 96 mg/dL (ref 65–99)
Sodium: 135 mmol/L — ABNORMAL LOW (ref 136–145)

## 2012-12-08 LAB — BASIC METABOLIC PANEL
Calcium, Total: 9.5 mg/dL (ref 8.5–10.1)
Chloride: 104 mmol/L (ref 98–107)
Co2: 27 mmol/L (ref 21–32)
Creatinine: 2.24 mg/dL — ABNORMAL HIGH (ref 0.60–1.30)
EGFR (African American): 22 — ABNORMAL LOW
EGFR (Non-African Amer.): 19 — ABNORMAL LOW
Osmolality: 288 (ref 275–301)
Potassium: 4.9 mmol/L (ref 3.5–5.1)
Sodium: 137 mmol/L (ref 136–145)

## 2012-12-15 LAB — BASIC METABOLIC PANEL WITH GFR
Anion Gap: 6 — ABNORMAL LOW (ref 7–16)
BUN: 67 mg/dL — ABNORMAL HIGH (ref 7–18)
Calcium, Total: 9.7 mg/dL (ref 8.5–10.1)
Chloride: 101 mmol/L (ref 98–107)
Co2: 30 mmol/L (ref 21–32)
Creatinine: 2.43 mg/dL — ABNORMAL HIGH (ref 0.60–1.30)
EGFR (African American): 20 — ABNORMAL LOW
EGFR (Non-African Amer.): 17 — ABNORMAL LOW
Glucose: 83 mg/dL (ref 65–99)
Osmolality: 292 (ref 275–301)
Potassium: 3.9 mmol/L (ref 3.5–5.1)
Sodium: 137 mmol/L (ref 136–145)

## 2012-12-22 LAB — BASIC METABOLIC PANEL
Anion Gap: 6 — ABNORMAL LOW (ref 7–16)
BUN: 82 mg/dL — ABNORMAL HIGH (ref 7–18)
Calcium, Total: 9.5 mg/dL (ref 8.5–10.1)
Chloride: 102 mmol/L (ref 98–107)
Co2: 28 mmol/L (ref 21–32)
EGFR (African American): 17 — ABNORMAL LOW
Glucose: 92 mg/dL (ref 65–99)
Potassium: 4.1 mmol/L (ref 3.5–5.1)

## 2012-12-26 ENCOUNTER — Encounter: Payer: Self-pay | Admitting: Internal Medicine

## 2013-12-26 ENCOUNTER — Ambulatory Visit: Payer: Self-pay | Admitting: Hospice and Palliative Medicine

## 2014-01-06 ENCOUNTER — Emergency Department: Payer: Self-pay | Admitting: Emergency Medicine

## 2014-01-06 LAB — TROPONIN I: Troponin-I: 0.05 ng/mL

## 2014-01-06 LAB — CBC
HCT: 29.8 % — AB (ref 35.0–47.0)
HGB: 9.4 g/dL — ABNORMAL LOW (ref 12.0–16.0)
MCH: 30.4 pg (ref 26.0–34.0)
MCHC: 31.5 g/dL — ABNORMAL LOW (ref 32.0–36.0)
MCV: 97 fL (ref 80–100)
PLATELETS: 256 10*3/uL (ref 150–440)
RBC: 3.09 10*6/uL — ABNORMAL LOW (ref 3.80–5.20)
RDW: 14.8 % — AB (ref 11.5–14.5)
WBC: 6.3 10*3/uL (ref 3.6–11.0)

## 2014-01-06 LAB — BASIC METABOLIC PANEL
ANION GAP: 8 (ref 7–16)
BUN: 36 mg/dL — AB (ref 7–18)
CALCIUM: 8.5 mg/dL (ref 8.5–10.1)
CHLORIDE: 108 mmol/L — AB (ref 98–107)
Co2: 28 mmol/L (ref 21–32)
Creatinine: 3.67 mg/dL — ABNORMAL HIGH (ref 0.60–1.30)
EGFR (African American): 15 — ABNORMAL LOW
GFR CALC NON AF AMER: 12 — AB
Glucose: 151 mg/dL — ABNORMAL HIGH (ref 65–99)
Osmolality: 298 (ref 275–301)
POTASSIUM: 4.1 mmol/L (ref 3.5–5.1)
SODIUM: 144 mmol/L (ref 136–145)

## 2014-01-06 LAB — LIPASE, BLOOD: Lipase: 311 U/L (ref 73–393)

## 2014-01-06 LAB — PRO B NATRIURETIC PEPTIDE: B-TYPE NATIURETIC PEPTID: 18783 pg/mL — AB (ref 0–450)

## 2014-01-10 ENCOUNTER — Inpatient Hospital Stay: Payer: Self-pay | Admitting: Internal Medicine

## 2014-01-10 LAB — CK-MB
CK-MB: 1.3 ng/mL (ref 0.5–3.6)
CK-MB: 1.5 ng/mL (ref 0.5–3.6)
CK-MB: 1.2 ng/mL (ref 0.5–3.6)

## 2014-01-10 LAB — CBC
HCT: 27.1 % — ABNORMAL LOW (ref 35.0–47.0)
HGB: 8.9 g/dL — ABNORMAL LOW (ref 12.0–16.0)
MCH: 31.4 pg (ref 26.0–34.0)
MCHC: 32.7 g/dL (ref 32.0–36.0)
MCV: 96 fL (ref 80–100)
PLATELETS: 247 10*3/uL (ref 150–440)
RBC: 2.82 10*6/uL — ABNORMAL LOW (ref 3.80–5.20)
RDW: 14.9 % — ABNORMAL HIGH (ref 11.5–14.5)
WBC: 6.2 10*3/uL (ref 3.6–11.0)

## 2014-01-10 LAB — BASIC METABOLIC PANEL
Anion Gap: 7 (ref 7–16)
BUN: 48 mg/dL — ABNORMAL HIGH (ref 7–18)
CALCIUM: 8.1 mg/dL — AB (ref 8.5–10.1)
CREATININE: 3.95 mg/dL — AB (ref 0.60–1.30)
Chloride: 106 mmol/L (ref 98–107)
Co2: 28 mmol/L (ref 21–32)
EGFR (African American): 14 — ABNORMAL LOW
EGFR (Non-African Amer.): 11 — ABNORMAL LOW
GLUCOSE: 157 mg/dL — AB (ref 65–99)
Osmolality: 297 (ref 275–301)
POTASSIUM: 5 mmol/L (ref 3.5–5.1)
Sodium: 141 mmol/L (ref 136–145)

## 2014-01-10 LAB — TROPONIN I
TROPONIN-I: 0.04 ng/mL
Troponin-I: 0.04 ng/mL
Troponin-I: 0.05 ng/mL

## 2014-01-10 LAB — PRO B NATRIURETIC PEPTIDE: B-TYPE NATIURETIC PEPTID: 24919 pg/mL — AB (ref 0–450)

## 2014-01-11 LAB — BASIC METABOLIC PANEL
Anion Gap: 7 (ref 7–16)
BUN: 54 mg/dL — ABNORMAL HIGH (ref 7–18)
CO2: 29 mmol/L (ref 21–32)
Calcium, Total: 8.1 mg/dL — ABNORMAL LOW (ref 8.5–10.1)
Chloride: 105 mmol/L (ref 98–107)
Creatinine: 4.03 mg/dL — ABNORMAL HIGH (ref 0.60–1.30)
EGFR (African American): 13 — ABNORMAL LOW
EGFR (Non-African Amer.): 11 — ABNORMAL LOW
Glucose: 90 mg/dL (ref 65–99)
OSMOLALITY: 296 (ref 275–301)
POTASSIUM: 4.2 mmol/L (ref 3.5–5.1)
SODIUM: 141 mmol/L (ref 136–145)

## 2014-01-11 LAB — CBC WITH DIFFERENTIAL/PLATELET
BASOS PCT: 0.9 %
Basophil #: 0.1 10*3/uL (ref 0.0–0.1)
EOS PCT: 2 %
Eosinophil #: 0.1 10*3/uL (ref 0.0–0.7)
HCT: 23.8 % — AB (ref 35.0–47.0)
HGB: 7.4 g/dL — ABNORMAL LOW (ref 12.0–16.0)
Lymphocyte #: 1.1 10*3/uL (ref 1.0–3.6)
Lymphocyte %: 17.6 %
MCH: 30 pg (ref 26.0–34.0)
MCHC: 31.2 g/dL — AB (ref 32.0–36.0)
MCV: 96 fL (ref 80–100)
Monocyte #: 0.8 x10 3/mm (ref 0.2–0.9)
Monocyte %: 12.9 %
NEUTROS ABS: 4.1 10*3/uL (ref 1.4–6.5)
NEUTROS PCT: 66.6 %
Platelet: 211 10*3/uL (ref 150–440)
RBC: 2.48 10*6/uL — AB (ref 3.80–5.20)
RDW: 14.8 % — ABNORMAL HIGH (ref 11.5–14.5)
WBC: 6.1 10*3/uL (ref 3.6–11.0)

## 2014-01-11 LAB — PROTEIN / CREATININE RATIO, URINE
Creatinine, Urine: 43.2 mg/dL (ref 30.0–125.0)
PROTEIN, RANDOM URINE: 9 mg/dL (ref 0–12)
Protein/Creat. Ratio: 208 mg/gCREAT — ABNORMAL HIGH (ref 0–200)

## 2014-01-11 LAB — PHOSPHORUS: PHOSPHORUS: 3.8 mg/dL (ref 2.5–4.9)

## 2014-01-12 LAB — CBC WITH DIFFERENTIAL/PLATELET
BASOS ABS: 0 10*3/uL (ref 0.0–0.1)
Basophil %: 0.9 %
EOS PCT: 2.5 %
Eosinophil #: 0.1 10*3/uL (ref 0.0–0.7)
HCT: 23.9 % — ABNORMAL LOW (ref 35.0–47.0)
HGB: 7.7 g/dL — AB (ref 12.0–16.0)
LYMPHS ABS: 1.1 10*3/uL (ref 1.0–3.6)
LYMPHS PCT: 19.3 %
MCH: 30.9 pg (ref 26.0–34.0)
MCHC: 32.1 g/dL (ref 32.0–36.0)
MCV: 96 fL (ref 80–100)
MONOS PCT: 13.9 %
Monocyte #: 0.8 x10 3/mm (ref 0.2–0.9)
Neutrophil #: 3.6 10*3/uL (ref 1.4–6.5)
Neutrophil %: 63.4 %
Platelet: 215 10*3/uL (ref 150–440)
RBC: 2.49 10*6/uL — AB (ref 3.80–5.20)
RDW: 14.9 % — ABNORMAL HIGH (ref 11.5–14.5)
WBC: 5.6 10*3/uL (ref 3.6–11.0)

## 2014-01-12 LAB — BASIC METABOLIC PANEL
Anion Gap: 7 (ref 7–16)
BUN: 65 mg/dL — AB (ref 7–18)
Calcium, Total: 8.6 mg/dL (ref 8.5–10.1)
Chloride: 102 mmol/L (ref 98–107)
Co2: 28 mmol/L (ref 21–32)
Creatinine: 4.48 mg/dL — ABNORMAL HIGH (ref 0.60–1.30)
Glucose: 100 mg/dL — ABNORMAL HIGH (ref 65–99)
Osmolality: 293 (ref 275–301)
POTASSIUM: 4.6 mmol/L (ref 3.5–5.1)
Sodium: 137 mmol/L (ref 136–145)

## 2014-01-12 LAB — PROTEIN ELECTROPHORESIS(ARMC)

## 2014-01-13 LAB — BASIC METABOLIC PANEL
Anion Gap: 7 (ref 7–16)
BUN: 75 mg/dL — AB (ref 7–18)
CALCIUM: 8.4 mg/dL — AB (ref 8.5–10.1)
CHLORIDE: 106 mmol/L (ref 98–107)
Co2: 28 mmol/L (ref 21–32)
Creatinine: 4.35 mg/dL — ABNORMAL HIGH (ref 0.60–1.30)
EGFR (Non-African Amer.): 10 — ABNORMAL LOW
GFR CALC AF AMER: 12 — AB
GLUCOSE: 94 mg/dL (ref 65–99)
Osmolality: 303 (ref 275–301)
POTASSIUM: 5 mmol/L (ref 3.5–5.1)
Sodium: 141 mmol/L (ref 136–145)

## 2014-01-13 LAB — CBC WITH DIFFERENTIAL/PLATELET
Basophil #: 0.1 10*3/uL (ref 0.0–0.1)
Basophil %: 0.8 %
EOS ABS: 0.1 10*3/uL (ref 0.0–0.7)
Eosinophil %: 1.9 %
HCT: 24 % — ABNORMAL LOW (ref 35.0–47.0)
HGB: 7.6 g/dL — ABNORMAL LOW (ref 12.0–16.0)
LYMPHS ABS: 0.8 10*3/uL — AB (ref 1.0–3.6)
LYMPHS PCT: 12.6 %
MCH: 30.5 pg (ref 26.0–34.0)
MCHC: 31.6 g/dL — ABNORMAL LOW (ref 32.0–36.0)
MCV: 97 fL (ref 80–100)
MONOS PCT: 13.5 %
Monocyte #: 0.9 x10 3/mm (ref 0.2–0.9)
Neutrophil #: 4.8 10*3/uL (ref 1.4–6.5)
Neutrophil %: 71.2 %
PLATELETS: 227 10*3/uL (ref 150–440)
RBC: 2.49 10*6/uL — AB (ref 3.80–5.20)
RDW: 14.8 % — AB (ref 11.5–14.5)
WBC: 6.7 10*3/uL (ref 3.6–11.0)

## 2014-01-14 LAB — PHOSPHORUS: Phosphorus: 3.4 mg/dL (ref 2.5–4.9)

## 2014-01-15 LAB — URINALYSIS, COMPLETE
BACTERIA: NONE SEEN
Bilirubin,UR: NEGATIVE
Blood: NEGATIVE
Glucose,UR: NEGATIVE mg/dL (ref 0–75)
Ketone: NEGATIVE
Nitrite: NEGATIVE
PH: 8 (ref 4.5–8.0)
Protein: NEGATIVE
RBC,UR: 2 /HPF (ref 0–5)
SPECIFIC GRAVITY: 1.01 (ref 1.003–1.030)
Squamous Epithelial: <1

## 2014-01-17 LAB — CBC WITH DIFFERENTIAL/PLATELET
BASOS PCT: 0.9 %
Basophil #: 0.1 10*3/uL (ref 0.0–0.1)
EOS ABS: 0.1 10*3/uL (ref 0.0–0.7)
Eosinophil %: 1.6 %
HCT: 25.6 % — AB (ref 35.0–47.0)
HGB: 7.9 g/dL — ABNORMAL LOW (ref 12.0–16.0)
Lymphocyte #: 0.9 10*3/uL — ABNORMAL LOW (ref 1.0–3.6)
Lymphocyte %: 13.5 %
MCH: 29.8 pg (ref 26.0–34.0)
MCHC: 31 g/dL — ABNORMAL LOW (ref 32.0–36.0)
MCV: 96 fL (ref 80–100)
MONOS PCT: 15.2 %
Monocyte #: 1 x10 3/mm — ABNORMAL HIGH (ref 0.2–0.9)
Neutrophil #: 4.5 10*3/uL (ref 1.4–6.5)
Neutrophil %: 68.8 %
Platelet: 222 10*3/uL (ref 150–440)
RBC: 2.66 10*6/uL — AB (ref 3.80–5.20)
RDW: 14.7 % — AB (ref 11.5–14.5)
WBC: 6.6 10*3/uL (ref 3.6–11.0)

## 2014-01-17 LAB — RENAL FUNCTION PANEL
Albumin: 2.6 g/dL — ABNORMAL LOW (ref 3.4–5.0)
Anion Gap: 5 — ABNORMAL LOW (ref 7–16)
BUN: 34 mg/dL — ABNORMAL HIGH (ref 7–18)
Calcium, Total: 8.3 mg/dL — ABNORMAL LOW (ref 8.5–10.1)
Chloride: 108 mmol/L — ABNORMAL HIGH (ref 98–107)
Co2: 30 mmol/L (ref 21–32)
Creatinine: 2.35 mg/dL — ABNORMAL HIGH (ref 0.60–1.30)
EGFR (African American): 25 — ABNORMAL LOW
GFR CALC NON AF AMER: 21 — AB
Glucose: 90 mg/dL (ref 65–99)
Osmolality: 292 (ref 275–301)
POTASSIUM: 4.1 mmol/L (ref 3.5–5.1)
Phosphorus: 2.9 mg/dL (ref 2.5–4.9)
Sodium: 143 mmol/L (ref 136–145)

## 2014-01-17 LAB — UR PROT ELECTROPHORESIS, URINE RANDOM

## 2014-01-19 LAB — CBC WITH DIFFERENTIAL/PLATELET
BASOS PCT: 1.1 %
Basophil #: 0.1 10*3/uL (ref 0.0–0.1)
Eosinophil #: 0.2 10*3/uL (ref 0.0–0.7)
Eosinophil %: 2.5 %
HCT: 26.9 % — AB (ref 35.0–47.0)
HGB: 8.4 g/dL — ABNORMAL LOW (ref 12.0–16.0)
LYMPHS PCT: 14.7 %
Lymphocyte #: 0.9 10*3/uL — ABNORMAL LOW (ref 1.0–3.6)
MCH: 29.8 pg (ref 26.0–34.0)
MCHC: 31.2 g/dL — ABNORMAL LOW (ref 32.0–36.0)
MCV: 95 fL (ref 80–100)
MONOS PCT: 12.3 %
Monocyte #: 0.8 x10 3/mm (ref 0.2–0.9)
Neutrophil #: 4.5 10*3/uL (ref 1.4–6.5)
Neutrophil %: 69.4 %
PLATELETS: 254 10*3/uL (ref 150–440)
RBC: 2.82 10*6/uL — ABNORMAL LOW (ref 3.80–5.20)
RDW: 14.8 % — AB (ref 11.5–14.5)
WBC: 6.4 10*3/uL (ref 3.6–11.0)

## 2014-01-19 LAB — RENAL FUNCTION PANEL
Albumin: 2.7 g/dL — ABNORMAL LOW (ref 3.4–5.0)
Anion Gap: 4 — ABNORMAL LOW (ref 7–16)
BUN: 31 mg/dL — ABNORMAL HIGH (ref 7–18)
CALCIUM: 8.6 mg/dL (ref 8.5–10.1)
Chloride: 109 mmol/L — ABNORMAL HIGH (ref 98–107)
Co2: 28 mmol/L (ref 21–32)
Creatinine: 2.6 mg/dL — ABNORMAL HIGH (ref 0.60–1.30)
EGFR (African American): 22 — ABNORMAL LOW
GFR CALC NON AF AMER: 18 — AB
GLUCOSE: 95 mg/dL (ref 65–99)
Osmolality: 288 (ref 275–301)
Phosphorus: 3.8 mg/dL (ref 2.5–4.9)
Potassium: 4.1 mmol/L (ref 3.5–5.1)
SODIUM: 141 mmol/L (ref 136–145)

## 2014-01-21 LAB — HEMOGLOBIN: HGB: 8.3 g/dL — AB (ref 12.0–16.0)

## 2014-01-21 LAB — PHOSPHORUS: Phosphorus: 2.8 mg/dL (ref 2.5–4.9)

## 2014-01-23 ENCOUNTER — Emergency Department: Payer: Self-pay | Admitting: Internal Medicine

## 2014-01-23 LAB — CBC WITH DIFFERENTIAL/PLATELET
Basophil #: 0.1 10*3/uL (ref 0.0–0.1)
Basophil %: 1 %
EOS PCT: 1.3 %
Eosinophil #: 0.1 10*3/uL (ref 0.0–0.7)
HCT: 28.2 % — ABNORMAL LOW (ref 35.0–47.0)
HGB: 8.7 g/dL — AB (ref 12.0–16.0)
LYMPHS PCT: 13.5 %
Lymphocyte #: 1 10*3/uL (ref 1.0–3.6)
MCH: 29.2 pg (ref 26.0–34.0)
MCHC: 30.8 g/dL — AB (ref 32.0–36.0)
MCV: 95 fL (ref 80–100)
Monocyte #: 1.1 x10 3/mm — ABNORMAL HIGH (ref 0.2–0.9)
Monocyte %: 14.6 %
NEUTROS ABS: 5.3 10*3/uL (ref 1.4–6.5)
Neutrophil %: 69.6 %
Platelet: 278 10*3/uL (ref 150–440)
RBC: 2.97 10*6/uL — ABNORMAL LOW (ref 3.80–5.20)
RDW: 14.5 % (ref 11.5–14.5)
WBC: 7.5 10*3/uL (ref 3.6–11.0)

## 2014-01-23 LAB — COMPREHENSIVE METABOLIC PANEL
ALK PHOS: 81 U/L
ALT: 42 U/L
ANION GAP: 7 (ref 7–16)
Albumin: 3.1 g/dL — ABNORMAL LOW (ref 3.4–5.0)
BUN: 32 mg/dL — AB (ref 7–18)
Bilirubin,Total: 0.3 mg/dL (ref 0.2–1.0)
CALCIUM: 9 mg/dL (ref 8.5–10.1)
CHLORIDE: 103 mmol/L (ref 98–107)
CO2: 30 mmol/L (ref 21–32)
Creatinine: 3.13 mg/dL — ABNORMAL HIGH (ref 0.60–1.30)
EGFR (Non-African Amer.): 15 — ABNORMAL LOW
GFR CALC AF AMER: 18 — AB
Glucose: 110 mg/dL — ABNORMAL HIGH (ref 65–99)
Osmolality: 287 (ref 275–301)
Potassium: 4.8 mmol/L (ref 3.5–5.1)
SGOT(AST): 49 U/L — ABNORMAL HIGH (ref 15–37)
Sodium: 140 mmol/L (ref 136–145)
Total Protein: 6.8 g/dL (ref 6.4–8.2)

## 2014-01-23 LAB — LIPASE, BLOOD: Lipase: 339 U/L (ref 73–393)

## 2014-01-23 LAB — TROPONIN I: Troponin-I: 0.04 ng/mL

## 2014-01-25 ENCOUNTER — Ambulatory Visit: Payer: Self-pay | Admitting: Hospice and Palliative Medicine

## 2014-01-30 ENCOUNTER — Emergency Department: Payer: Self-pay | Admitting: Emergency Medicine

## 2014-01-30 LAB — URINALYSIS, COMPLETE
Bacteria: NONE SEEN
Bilirubin,UR: NEGATIVE
GLUCOSE, UR: NEGATIVE mg/dL (ref 0–75)
KETONE: NEGATIVE
Nitrite: NEGATIVE
Ph: 8 (ref 4.5–8.0)
RBC,UR: 2 /HPF (ref 0–5)
SPECIFIC GRAVITY: 1.005 (ref 1.003–1.030)
Squamous Epithelial: 44

## 2014-01-30 LAB — CBC
HCT: 28.2 % — ABNORMAL LOW (ref 35.0–47.0)
HGB: 8.7 g/dL — AB (ref 12.0–16.0)
MCH: 29.4 pg (ref 26.0–34.0)
MCHC: 30.8 g/dL — ABNORMAL LOW (ref 32.0–36.0)
MCV: 96 fL (ref 80–100)
PLATELETS: 322 10*3/uL (ref 150–440)
RBC: 2.95 10*6/uL — AB (ref 3.80–5.20)
RDW: 15.4 % — ABNORMAL HIGH (ref 11.5–14.5)
WBC: 7.5 10*3/uL (ref 3.6–11.0)

## 2014-01-30 LAB — BASIC METABOLIC PANEL
Anion Gap: 6 — ABNORMAL LOW (ref 7–16)
BUN: 17 mg/dL (ref 7–18)
CO2: 30 mmol/L (ref 21–32)
Calcium, Total: 8.6 mg/dL (ref 8.5–10.1)
Chloride: 105 mmol/L (ref 98–107)
Creatinine: 2.73 mg/dL — ABNORMAL HIGH (ref 0.60–1.30)
EGFR (African American): 21 — ABNORMAL LOW
GFR CALC NON AF AMER: 17 — AB
GLUCOSE: 93 mg/dL (ref 65–99)
Osmolality: 282 (ref 275–301)
Potassium: 4.2 mmol/L (ref 3.5–5.1)
Sodium: 141 mmol/L (ref 136–145)

## 2014-01-30 LAB — TROPONIN I: Troponin-I: 0.05 ng/mL

## 2014-01-31 LAB — URINE CULTURE

## 2014-02-02 ENCOUNTER — Emergency Department: Payer: Self-pay | Admitting: Emergency Medicine

## 2014-02-02 LAB — CBC
HCT: 30.4 % — ABNORMAL LOW (ref 35.0–47.0)
HGB: 9.5 g/dL — ABNORMAL LOW (ref 12.0–16.0)
MCH: 29.5 pg (ref 26.0–34.0)
MCHC: 31.1 g/dL — ABNORMAL LOW (ref 32.0–36.0)
MCV: 95 fL (ref 80–100)
Platelet: 343 10*3/uL (ref 150–440)
RBC: 3.2 10*6/uL — AB (ref 3.80–5.20)
RDW: 15.8 % — ABNORMAL HIGH (ref 11.5–14.5)
WBC: 6.1 10*3/uL (ref 3.6–11.0)

## 2014-02-02 LAB — BASIC METABOLIC PANEL
ANION GAP: 4 — AB (ref 7–16)
BUN: 21 mg/dL — ABNORMAL HIGH (ref 7–18)
CALCIUM: 8.9 mg/dL (ref 8.5–10.1)
CO2: 33 mmol/L — AB (ref 21–32)
CREATININE: 3.32 mg/dL — AB (ref 0.60–1.30)
Chloride: 104 mmol/L (ref 98–107)
EGFR (African American): 17 — ABNORMAL LOW
EGFR (Non-African Amer.): 14 — ABNORMAL LOW
GLUCOSE: 108 mg/dL — AB (ref 65–99)
Osmolality: 285 (ref 275–301)
Potassium: 4.3 mmol/L (ref 3.5–5.1)
Sodium: 141 mmol/L (ref 136–145)

## 2014-02-02 LAB — TROPONIN I: Troponin-I: 0.04 ng/mL

## 2014-02-09 ENCOUNTER — Emergency Department: Payer: Self-pay | Admitting: Emergency Medicine

## 2014-02-14 ENCOUNTER — Inpatient Hospital Stay: Payer: Self-pay | Admitting: Internal Medicine

## 2014-02-14 LAB — PRO B NATRIURETIC PEPTIDE: B-Type Natriuretic Peptide: 51430 pg/mL — ABNORMAL HIGH (ref 0–450)

## 2014-02-14 LAB — CBC
HCT: 34.9 % — ABNORMAL LOW (ref 35.0–47.0)
HGB: 10.7 g/dL — AB (ref 12.0–16.0)
MCH: 29.9 pg (ref 26.0–34.0)
MCHC: 30.7 g/dL — AB (ref 32.0–36.0)
MCV: 97 fL (ref 80–100)
Platelet: 319 10*3/uL (ref 150–440)
RBC: 3.58 10*6/uL — ABNORMAL LOW (ref 3.80–5.20)
RDW: 18.3 % — ABNORMAL HIGH (ref 11.5–14.5)
WBC: 6.3 10*3/uL (ref 3.6–11.0)

## 2014-02-14 LAB — BASIC METABOLIC PANEL
Anion Gap: 7 (ref 7–16)
BUN: 32 mg/dL — AB (ref 7–18)
CO2: 31 mmol/L (ref 21–32)
Calcium, Total: 9.2 mg/dL (ref 8.5–10.1)
Chloride: 105 mmol/L (ref 98–107)
Creatinine: 3.97 mg/dL — ABNORMAL HIGH (ref 0.60–1.30)
EGFR (African American): 14 — ABNORMAL LOW
EGFR (Non-African Amer.): 11 — ABNORMAL LOW
Glucose: 144 mg/dL — ABNORMAL HIGH (ref 65–99)
Osmolality: 294 (ref 275–301)
Potassium: 3.8 mmol/L (ref 3.5–5.1)
Sodium: 143 mmol/L (ref 136–145)

## 2014-02-14 LAB — PHOSPHORUS: Phosphorus: 4.4 mg/dL (ref 2.5–4.9)

## 2014-02-14 LAB — TROPONIN I: Troponin-I: 0.04 ng/mL

## 2014-02-15 ENCOUNTER — Inpatient Hospital Stay: Payer: Self-pay | Admitting: Internal Medicine

## 2014-02-15 LAB — URINALYSIS, COMPLETE
BLOOD: NEGATIVE
Bacteria: NONE SEEN
Bilirubin,UR: NEGATIVE
Glucose,UR: NEGATIVE mg/dL (ref 0–75)
Hyaline Cast: 6
Ketone: NEGATIVE
Leukocyte Esterase: NEGATIVE
Nitrite: NEGATIVE
Ph: 6 (ref 4.5–8.0)
Protein: 30
RBC,UR: <1 /HPF (ref 0–5)
SPECIFIC GRAVITY: 1.012 (ref 1.003–1.030)
Squamous Epithelial: NONE SEEN
WBC UR: 2 /HPF (ref 0–5)

## 2014-02-15 LAB — COMPREHENSIVE METABOLIC PANEL
ALBUMIN: 2.8 g/dL — AB (ref 3.4–5.0)
ALK PHOS: 85 U/L
ALT: 23 U/L
AST: 44 U/L — AB (ref 15–37)
Anion Gap: 9 (ref 7–16)
BUN: 18 mg/dL (ref 7–18)
Bilirubin,Total: 0.4 mg/dL (ref 0.2–1.0)
CALCIUM: 8.6 mg/dL (ref 8.5–10.1)
CHLORIDE: 98 mmol/L (ref 98–107)
CO2: 28 mmol/L (ref 21–32)
Creatinine: 2.44 mg/dL — ABNORMAL HIGH (ref 0.60–1.30)
GFR CALC AF AMER: 24 — AB
GFR CALC NON AF AMER: 20 — AB
GLUCOSE: 109 mg/dL — AB (ref 65–99)
Osmolality: 273 (ref 275–301)
Potassium: 3.7 mmol/L (ref 3.5–5.1)
SODIUM: 135 mmol/L — AB (ref 136–145)
Total Protein: 6.7 g/dL (ref 6.4–8.2)

## 2014-02-15 LAB — CBC
HCT: 34.3 % — ABNORMAL LOW (ref 35.0–47.0)
HGB: 10.5 g/dL — AB (ref 12.0–16.0)
MCH: 29.8 pg (ref 26.0–34.0)
MCHC: 30.8 g/dL — ABNORMAL LOW (ref 32.0–36.0)
MCV: 97 fL (ref 80–100)
PLATELETS: 216 10*3/uL (ref 150–440)
RBC: 3.53 10*6/uL — AB (ref 3.80–5.20)
RDW: 18.5 % — ABNORMAL HIGH (ref 11.5–14.5)
WBC: 4.2 10*3/uL (ref 3.6–11.0)

## 2014-02-15 LAB — BASIC METABOLIC PANEL
Anion Gap: 7 (ref 7–16)
BUN: 12 mg/dL (ref 7–18)
CALCIUM: 8.4 mg/dL — AB (ref 8.5–10.1)
CHLORIDE: 102 mmol/L (ref 98–107)
CO2: 32 mmol/L (ref 21–32)
CREATININE: 1.87 mg/dL — AB (ref 0.60–1.30)
GFR CALC AF AMER: 33 — AB
GFR CALC NON AF AMER: 27 — AB
Glucose: 87 mg/dL (ref 65–99)
OSMOLALITY: 280 (ref 275–301)
Potassium: 3.6 mmol/L (ref 3.5–5.1)
Sodium: 141 mmol/L (ref 136–145)

## 2014-02-15 LAB — CBC WITH DIFFERENTIAL/PLATELET
Basophil #: 0 10*3/uL (ref 0.0–0.1)
Basophil %: 0.7 %
EOS PCT: 2.4 %
Eosinophil #: 0.2 10*3/uL (ref 0.0–0.7)
HCT: 28.8 % — ABNORMAL LOW (ref 35.0–47.0)
HGB: 9 g/dL — AB (ref 12.0–16.0)
Lymphocyte #: 1 10*3/uL (ref 1.0–3.6)
Lymphocyte %: 14.7 %
MCH: 30.2 pg (ref 26.0–34.0)
MCHC: 31.3 g/dL — ABNORMAL LOW (ref 32.0–36.0)
MCV: 96 fL (ref 80–100)
MONO ABS: 0.6 x10 3/mm (ref 0.2–0.9)
Monocyte %: 9.4 %
NEUTROS ABS: 4.8 10*3/uL (ref 1.4–6.5)
Neutrophil %: 72.8 %
Platelet: 188 10*3/uL (ref 150–440)
RBC: 2.99 10*6/uL — ABNORMAL LOW (ref 3.80–5.20)
RDW: 18.4 % — ABNORMAL HIGH (ref 11.5–14.5)
WBC: 6.6 10*3/uL (ref 3.6–11.0)

## 2014-02-15 LAB — TROPONIN I: TROPONIN-I: 0.04 ng/mL

## 2014-02-16 LAB — CBC WITH DIFFERENTIAL/PLATELET
Basophil #: 0 10*3/uL (ref 0.0–0.1)
Basophil %: 0.7 %
EOS ABS: 0.1 10*3/uL (ref 0.0–0.7)
Eosinophil %: 3.4 %
HCT: 28.7 % — AB (ref 35.0–47.0)
HGB: 8.9 g/dL — AB (ref 12.0–16.0)
Lymphocyte #: 0.8 10*3/uL — ABNORMAL LOW (ref 1.0–3.6)
Lymphocyte %: 22.3 %
MCH: 29.7 pg (ref 26.0–34.0)
MCHC: 30.9 g/dL — AB (ref 32.0–36.0)
MCV: 96 fL (ref 80–100)
MONO ABS: 0.6 x10 3/mm (ref 0.2–0.9)
MONOS PCT: 14.9 %
NEUTROS ABS: 2.2 10*3/uL (ref 1.4–6.5)
Neutrophil %: 58.7 %
PLATELETS: 175 10*3/uL (ref 150–440)
RBC: 2.98 10*6/uL — ABNORMAL LOW (ref 3.80–5.20)
RDW: 18.3 % — ABNORMAL HIGH (ref 11.5–14.5)
WBC: 3.8 10*3/uL (ref 3.6–11.0)

## 2014-02-16 LAB — BASIC METABOLIC PANEL
Anion Gap: 5 — ABNORMAL LOW (ref 7–16)
BUN: 19 mg/dL — ABNORMAL HIGH (ref 7–18)
CREATININE: 2.53 mg/dL — AB (ref 0.60–1.30)
Calcium, Total: 7.9 mg/dL — ABNORMAL LOW (ref 8.5–10.1)
Chloride: 99 mmol/L (ref 98–107)
Co2: 32 mmol/L (ref 21–32)
GFR CALC AF AMER: 23 — AB
GFR CALC NON AF AMER: 19 — AB
Glucose: 124 mg/dL — ABNORMAL HIGH (ref 65–99)
Osmolality: 276 (ref 275–301)
POTASSIUM: 4 mmol/L (ref 3.5–5.1)
Sodium: 136 mmol/L (ref 136–145)

## 2014-02-16 LAB — PHOSPHORUS: Phosphorus: 4 mg/dL (ref 2.5–4.9)

## 2014-02-17 LAB — URINE CULTURE

## 2014-02-17 LAB — PHOSPHORUS: Phosphorus: 2.4 mg/dL — ABNORMAL LOW (ref 2.5–4.9)

## 2014-02-20 LAB — CULTURE, BLOOD (SINGLE)

## 2014-02-25 ENCOUNTER — Ambulatory Visit: Payer: Self-pay | Admitting: Oncology

## 2014-03-15 LAB — COMPREHENSIVE METABOLIC PANEL
AST: 46 U/L — AB (ref 15–37)
Albumin: 3.3 g/dL — ABNORMAL LOW (ref 3.4–5.0)
Alkaline Phosphatase: 103 U/L
Anion Gap: 6 — ABNORMAL LOW (ref 7–16)
BILIRUBIN TOTAL: 0.4 mg/dL (ref 0.2–1.0)
BUN: 23 mg/dL — ABNORMAL HIGH (ref 7–18)
CHLORIDE: 102 mmol/L (ref 98–107)
CO2: 29 mmol/L (ref 21–32)
CREATININE: 2.3 mg/dL — AB (ref 0.60–1.30)
Calcium, Total: 8.7 mg/dL (ref 8.5–10.1)
GFR CALC AF AMER: 26 — AB
GFR CALC NON AF AMER: 21 — AB
Glucose: 102 mg/dL — ABNORMAL HIGH (ref 65–99)
Osmolality: 278 (ref 275–301)
Potassium: 4.6 mmol/L (ref 3.5–5.1)
SGPT (ALT): 22 U/L
Sodium: 137 mmol/L (ref 136–145)
Total Protein: 6.8 g/dL (ref 6.4–8.2)

## 2014-03-15 LAB — CBC
HCT: 39.1 % (ref 35.0–47.0)
HGB: 12 g/dL (ref 12.0–16.0)
MCH: 29.3 pg (ref 26.0–34.0)
MCHC: 30.7 g/dL — ABNORMAL LOW (ref 32.0–36.0)
MCV: 96 fL (ref 80–100)
Platelet: 197 10*3/uL (ref 150–440)
RBC: 4.09 10*6/uL (ref 3.80–5.20)
RDW: 17.4 % — ABNORMAL HIGH (ref 11.5–14.5)
WBC: 6.8 10*3/uL (ref 3.6–11.0)

## 2014-03-16 ENCOUNTER — Observation Stay: Payer: Self-pay | Admitting: Internal Medicine

## 2014-03-16 LAB — PHOSPHORUS: PHOSPHORUS: 3.6 mg/dL (ref 2.5–4.9)

## 2014-03-20 LAB — PROT IMMUNOELECTROPHORES(ARMC)

## 2014-03-20 LAB — KAPPA/LAMBDA FREE LIGHT CHAINS (ARMC)

## 2014-03-24 ENCOUNTER — Ambulatory Visit: Payer: Self-pay | Admitting: Oncology

## 2014-03-28 ENCOUNTER — Ambulatory Visit: Payer: Self-pay | Admitting: Oncology

## 2014-03-29 ENCOUNTER — Ambulatory Visit: Payer: Self-pay | Admitting: Oncology

## 2014-04-17 ENCOUNTER — Inpatient Hospital Stay: Payer: Self-pay | Admitting: Internal Medicine

## 2014-04-26 ENCOUNTER — Ambulatory Visit
Admit: 2014-04-26 | Disposition: A | Discharge status: Home / Self Care | Payer: Self-pay | Attending: Oncology | Admitting: Oncology

## 2014-05-01 ENCOUNTER — Emergency Department: Payer: Self-pay | Admitting: Emergency Medicine

## 2014-05-03 ENCOUNTER — Inpatient Hospital Stay: Payer: Self-pay | Admitting: Internal Medicine

## 2014-05-27 DEATH — deceased

## 2014-06-14 NOTE — Discharge Summary (Signed)
PATIENT NAME:  [name], [name] DATE OF BIRTH:  [birth year]  DATE OF ADMISSION:  10/16/2011 DATE OF DISCHARGE:  10/19/2011  PRIMARY CARE PHYSICIAN: Dr. Patrecia PaceMorayati    FINAL DIAGNOSES:  1. Encephalopathy, which improved.  2. Weakness, which improved.  3. Suspected urinary tract infection.  4. Acute on chronic renal failure, which improved.  5. Hypertension.  6. History of coronary artery disease. 7. History of cerebrovascular accident. 8. Cardiomyopathy.   MEDICATIONS ON DISCHARGE:  1. Plavix 75 mg daily.  2. Nitroglycerin 0.2 mg per hour transdermal film extended-release one patch transdermally once a day.  3. Metoprolol 50 mg extended-release 1 tablet daily.  4. Cipro 500 mg every 12 hours for three more days.   DO NOT TAKE: Hydrochlorothiazide.   ACTIVITY: Activity as tolerated. Stay hydrated.   DIET: Regular consistency.   REFERRAL: Home with home health PT.  FOLLOW-UP: Follow-up in 1 to 2 weeks with Dr. Patrecia PaceMorayati    REASON FOR ADMISSION: The patient was admitted 10/15/2011 and discharged 10/19/2011. The patient came in with bilateral leg weakness.   HISTORY OF PRESENT ILLNESS: The patient is an 81 year old female with hypertension, TIA history, and coronary artery disease history. She had bilateral leg weakness, cramping-like feeling affecting her walking. She was talking gibberish and she was admitted with lower extremity weakness and urinary tract infection. She was given IV fluids and antibiotic for urinary infection, Rocephin.   LABORATORY AND RADIOLOGICAL DATA DURING THE HOSPITAL COURSE: CT scan of the head showed no acute abnormality.   Chest x-ray no acute disease in the chest.   EKG showed normal sinus rhythm, left axis deviation, LVH.   Troponin negative. Glucose 111, BUN 36, creatinine 2.02, sodium 137, potassium 4.8, chloride 102, CO2 26, calcium 10.7. Liver function tests total protein slightly elevated at 8.4, albumin 4.3. White blood cell count  5.9, hemoglobin and hematocrit 12.4 and 37.1, platelet count 259. Urinalysis positive for 3+ leukocyte esterase and bacteria. Urine culture actually grew out contamination, mixed bacterial organisms. LDL 201, HDL 54, triglycerides 130. Creatinine on the 21st came down to 1.54. Creatinine on the 22nd came down to 1.15.   The patient had another CT scan of the head without contrast after a fall that showed no acute evidence of ischemic or hemorrhagic infarction, moderate diffuse cerebral and cerebellar atrophy with compensatory ventriculomegaly.   MRI of the brain without contrast showed white matter changes consistent with chronic ischemia, diffuse atrophy. No evidence of acute infarct.   Ultrasound of the carotids bilaterally showed no stenosis.   Echocardiogram showed EF of 25 to 35%, left atrium mildly dilated, right ventricular systolic pressure normal, severe anterior wall hypokinesis, moderate to severe septal hypokinesis, moderate to severe apical wall hypokinesis.   HOSPITAL COURSE PER PROBLEM LIST:  1. For [name] encephalopathy, this had improved. The patient was talking and thinking more coherently, believed to be secondary to dehydration and urinary tract infection.  2. For weakness, she progressively got stronger during the hospital course. The patient did walk 310 feet with physical therapy on the 23rd. The patient will go home with home health.  3. For the suspected urinary tract infection, the patient was given Rocephin during the hospital course IV. The urine culture actually grew out contamination with more than one organism. They were unable to get another urine culture from her. The patient was treated empirically on the urinalysis.  4. For [name] acute on chronic renal failure, that had improved with IV fluid  hydration. Hydrochlorothiazide was stopped.  5. For [name] hypertension, [name] metoprolol was increased to 50 mg daily. Blood pressure upon  discharge was 146/77.  6. For the history of coronary artery disease and CVA, the patient is on Plavix. 7. For the coronary artery disease, the patient is on metoprolol. 8. For [name] cardiomyopathy, can consider ACE inhibitor as outpatient. I did not start this here in the hospital secondary to the acute on chronic renal failure.  TIME SPENT ON DISCHARGE: 35 minutes.   ____________________________ Herschell Dimes. Renae Gloss, MD rjw:drc D: 10/19/2011 14:37:00 ET T: 10/19/2011 15:00:14 ET JOB#: [id number]  cc: Herschell Dimes. Renae Gloss, MD, <Dictator> Alan Mulder, MD Salley Scarlet MD ELECTRONICALLY SIGNED 10/23/2011 16:50

## 2014-06-14 NOTE — H&P (Signed)
PATIENT NAME:  [name], [name] MR#:  [id number] DATE OF BIRTH:  1923-04-02  DATE OF ADMISSION:  10/15/2011  PRIMARY CARE PHYSICIAN: Dr. Patrecia Pace    CHIEF COMPLAINT: Bilateral leg weakness.   HISTORY OF PRESENT ILLNESS: [name] is an 81 year old pleasant Caucasian female with past history of systemic hypertension and transient ischemic attack, history of coronary artery disease and hyperlipidemia. The patient was in her usual state of health until today in the afternoon; she felt bilateral leg weakness, cramping-like feeling. She also noticed that this was affecting her walking that is not steady. She was brought to the hospital for evaluation. Patient denies having any focal weakness other than her generalized weakness in her lower extremities. No slurred speech. No difficulty in swallowing. No headache. No blurring of vision or double vision. Nevertheless, her husband reported to the medical staff that besides her leg weakness he noticed that her talking was "gibberish" and he noticed that her walking was not normal. I asked the patient about that. She denies having any slurred speech or change in her language. Her work-up here at the Emergency Department was also consistent with significant urinary tract infection although she is asymptomatic.   REVIEW OF SYSTEMS: CONSTITUTIONAL: Denies any fever. No chills but she has mild fatigue and lower extremity weakness. EYES: No blurring of vision. No double vision. ENT: No hearing impairment. No sore throat. No dysphagia. CARDIOVASCULAR: No chest pain. No shortness of breath. No syncope. RESPIRATORY: No cough. No sputum production. No chest pain or shortness of breath. GASTROINTESTINAL: No abdominal pain. No vomiting. No diarrhea. GENITOURINARY: Denies any dysuria and no frequency of urination. MUSCULOSKELETAL: No joint pain or swelling. No muscular pain or swelling. INTEGUMENTARY: No skin rash. No ulcers. NEUROLOGY: No focal weakness, other than her  generalized lower extremity weakness feeling. No seizure activity. No headache. She is a little unsteady in her walk. PSYCHIATRY: No anxiety. No depression. ENDOCRINE: No polyuria, polydipsia. No heat or cold intolerance.   PAST MEDICAL HISTORY:  1. Systemic hypertension. 2. Hyperlipidemia. 3. Pernicious anemia.  4. Osteoarthritis.  5. Kidney stone.  6. Coronary artery disease, status post myocardial infarction in 1995. She underwent percutaneous transluminal coronary angioplasty at [facility].  7. History of depression.  8. Last admission to this hospital here was in April of this year with supraventricular tachycardia.    PAST SURGICAL HISTORY:  1. Kidney stone surgery.  2. Left hip surgery in 2002. 3. Tonsillectomy.  4. History of breast biopsy without evidence of cancer.   SOCIAL HABITS: Nonsmoker. She has a remote history of smoking. She quit about 20 years ago. No history of alcohol or drug abuse.   SOCIAL HISTORY: She is married, living with her husband. 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   FAMILY HISTORY: Her mother died from heart attack. Her father died from complications of stroke. She has a brother who was diagnosed to have lung cancer.   ADMISSION MEDICATIONS:  1. Nitroglycerin transdermal patch 0.2 mg once a day.  2. Metoprolol 25 mg once a day using metoprolol succinate.  3. Hydrochlorothiazide 12.5 mg once a day. 4. Plavix 75 mg a day.   ALLERGIES: Penicillin causing skin rash.   PHYSICAL EXAMINATION:  VITAL SIGNS: Blood pressure 145/72, respiratory rate 20, pulse 82, temperature 97.6, oxygen saturation 98%.   GENERAL APPEARANCE: Elderly female lying in bed, healthy looking, energetic, engaging in conversation in no acute distress.  HEAD: No pallor. No icterus. No cyanosis.   ENT: Hearing was normal. Nasal mucosa, lips, tongue were normal. Eye examination revealed  normal eyelids and conjunctivae. Pupils about 5 mm, equal and reactive to light.   NECK: Supple. Trachea at midline. No thyromegaly. No cervical lymphadenopathy. No masses.   HEART: Normal S1, S2. No S3 or S4. No murmur. No gallop. No carotid bruits.   RESPIRATORY: Normal breathing pattern without use of accessory muscles. No rales. No wheezing.   ABDOMEN: Soft without tenderness. No hepatosplenomegaly. No masses. No hernias.   SKIN: No ulcers. No subcutaneous nodules.   MUSCULOSKELETAL: No joint swelling. No clubbing.   NEUROLOGIC: Cranial nerves II through XII are intact. No focal motor deficit. Normal speech and swallowing.   PSYCHIATRIC: Patient is alert, oriented x3. Mood and affect were normal.   LABORATORY, DIAGNOSTIC, AND RADIOLOGICAL DATA: EKG showed normal sinus rhythm at rate of 82 per minute. Nonspecific T wave abnormalities, otherwise unremarkable EKG. CAT scan of the head showed no acute intracranial abnormalities. Evidence of white matter changes consistent with chronic ischemia. Chest x-ray showed no cardiomegaly. No effusion. No consolidation. Serum glucose 111, BUN 36, creatinine 2.02. Her creatinine in April of this year was ranging between 2.01 to 1.7 and 1.4. Serum sodium 137, potassium 4.8. Estimated GFR 22. Calcium 10.7. Liver function tests were normal except for slight elevation of protein at 8.4. Normal liver transaminases. Total CPK 74. Troponin less than 0.02. CBC showed white count 5900, hemoglobin 12, hematocrit 37, platelet count 259. Urinalysis was significant for +3 leukocyte esterase and 189 white blood cells.   ASSESSMENT:  1. Lower extremity weakness which is nonspecific. This could be secondary to generalized systemic problem rather than neurologic event although I cannot rule out transient ischemic attack as a possibility but likely she has progression of urinary tract infection, may be the culprit of her weakness.  2. Questionable transient ischemic  attack. The husband reported some change in her speech but patient denies.  3. Urinary tract infection.  4. Systemic hypertension.  5. Hyperlipidemia.  6. Osteoarthritis. 7. History of coronary artery disease.  8. History of supraventricular tachycardia in April of this year.   PLAN: Will admit the patient as observation. Follow up on her neurologic examination q.2 hours then q.4 hours. I will consult neurology for further input about whether her presentation is to be considered transient ischemic attack or not. Meanwhile I will continue Plavix. Gentle IV hydration. Since there is worsening of her chronic kidney disease there is some acute element of renal failure. Urine for culture and sensitivity and patient was started on Rocephin 1 gram daily pending results of her urine culture. Continue the rest of her home medications as listed above. The patient is requesting over and over having Advil PM, she takes 1 tablet and sometimes 2 to help her sleep. I will give her Benadryl as Advil is not really a sleeping pill. Patient indicates that she does have a LIVING WILL and she has a copy at home.   TIME SPENT: Time spent in evaluating this patient and reviewing her medical records took more than 55 minutes.   ____________________________ Carney CornersAmir M. Rudene Rearwish, MD amd:cms D: 10/15/2011 23:12:23 ET T: 10/16/2011 06:21:52 ET JOB#: [id number]  cc: Carney CornersAmir M. Rudene Rearwish, MD, <Dictator> Alan MulderShamil J. Morayati, MD Karolee OhsAMIR Dala DockM Jazlin Tapscott MD ELECTRONICALLY SIGNED 10/17/2011 0:24

## 2014-06-17 NOTE — H&P (Signed)
PATIENT NAME:  [name], Suzanne S MR#:  [id number] DATE OF BIRTH:  12-Mar-1923  DATE OF ADMISSION:  11/22/2012  PRIMARY CARE PHYSICIAN:  [name].  REFERRING PHYSICIAN:  Dr. Fanny [name].  CHIEF COMPLAINT: Shortness of breath today.   HISTORY OF PRESENT ILLNESS: An 81 year old Caucasian female with a history of cardiomyopathy and with an ejection fraction 30%, AICD status post MI twice, status post PTCA with stent placement, presented to the ED with stent placement, presented to the ED with shortness of breath today. The patient is alert, awake, oriented, in no acute distress. The patient was fine until 3 days ago. The patient does feel chest tightness, however the patient developed shortness of breath today, but she denies any cough, phlegm, or hematemesis. No nocturnal dyspnea, orthopnea or leg edema. The patient denies any fever or chills. No other symptoms. No weight gain. Good appetite. The patient came to the ED. Chest x-ray showed pulmonary edema. The patient was treated with Lasix in the ED.   PAST MEDICAL HISTORY:  1.  Cardiomyopathy, ejection fraction 30%.  2.  ASCVD, status post MI.  3.  Status post PTCA with stent placement.  4.  TIA.  5.  Hypertension.  6.  Nephrolithiasis.  7.  Hyperlipidemia.   SOCIAL HISTORY: No smoking or drinking, or illicit drugs.   FAMILY HISTORY: CAD, hypertension.   ALLERGIES: TO PENICILLIN AND LACTOSE.  HOME MEDICATIONS: 1.  Pravastatin 20 mg p.o. at bedtime.  2.  Lopressor 25 mg p.o. b.i.d.  3.  Plavix 75 mg p.o. daily.  4.  Amiodarone 200 mg p.o. daily.   REVIEW OF SYSTEMS:  CONSTITUTIONAL: The patient denies any fever or chills. No headache or dizziness, but has weakness.  EYES: No double vision or blurry vision.  EARS, NOSE, THROAT: No postnasal drip, slow speech, or dysphagia.  CARDIOVASCULAR: No chest pain, palpitation, orthopnea, or nocturnal dyspnea. No leg edema.  PULMONARY: Positive for shortness of breath, but no cough, sputum or  hematemesis.  GASTROINTESTINAL: No abdominal pain, nausea, vomiting or diarrhea.  GENITOURINARY: No dysuria, hematuria, or incontinence.  SKIN: No rash or jaundice.  NEUROLOGY: No syncope, loss of consciousness or seizure.  HEMATOLOGIC: No easy bruising or bleeding.  ENDOCRINE: No polyuria, polydipsia, heat or cold intolerance.   PHYSICAL EXAMINATION: VITAL SIGNS: Temperature 97.7, blood pressure was 197/87, and now is 167/84; pulse 60, O2 saturation 96% on room air.  GENERAL: The patient is alert, awake, oriented, in no acute distress.  HEENT: Pupils round, equal and reactive to light and accommodation. Moist oral mucosa. Clear oropharynx.  NECK: Supple. No JVD or carotid bruit. No lymphadenopathy. No thyromegaly.  CARDIOVASCULAR: S1, S2 regular rate and rhythm. No murmurs, gallops.  PULMONARY: Bilateral air entry. No wheezing, but has rales, bilaterally at the bases, especially on the right side. No use of accessory muscles to breathe.  ABDOMEN: Soft. No distention or tenderness. No organomegaly. Bowel sounds present.  EXTREMITIES: No edema, clubbing or cyanosis. No calf tenderness. Strong bilateral pedal pulses.   SKIN: No rash or jaundice.  NEUROLOGY: A and O x 3. No focal deficits. Power 5/5. Sensation intact.   LABORATORY DATA: Troponin less than 0.02. WBC 6.3, hemoglobin 10.5, platelets 227, glucose 110, BUN 25, creatinine 1.66, electrolytes normal. BNP 11,320.   Chest x-ray showed congestive heart failure superimposed with COPD.  EKG showed undetermined reason at 80 BPM, nonspecific intraventricular block.   IMPRESSIONS: 1.  Acute congestive heart failure, possible systolic dysfunction.  2.  Pulmonary edema.  3.  Accelerated hypertension.  4.  Coronary artery disease.   5.  Chronic kidney disease.  6.  Hyperlipidemia.  7.  Anemia.   PLAN OF TREATMENT: 1.  The patient will be admitted to the telemetry floor. We will continue O2 by nasal cannula. Will start on Lasix 20 mg IV  b.i.d. and add lisinopril, Coreg. We will request a cardiology consult from Dr. Lady Gary and start CHF protocol.  2.  We will continue Plavix and give hydralazine p.r.n. IV.  3.  Follow up lipid panel, TSH, and a BMP.  4.  I discussed the patient's condition and plan of treatment with the patient, the patient's husband.   THE PATIENT WANTS FULL CODE.   Time spent: About fifty-five minutes.    ____________________________ Shaune Pollack, MD qc:dm D: 11/22/2012 11:40:51 ET T: 11/22/2012 12:13:32 ET JOB#: [id number]  cc: Shaune Pollack, MD, <Dictator> Shaune Pollack MD ELECTRONICALLY SIGNED 11/23/2012 15:25

## 2014-06-17 NOTE — Discharge Summary (Signed)
  DATE OF BIRTH:  23-Sep-1923  DATE OF ADMISSION:  04/25/2012  DATE OF DISCHARGE:  04/26/2012  PRIMARY CARE PHYSICIAN:  Dr. Corliss ParishJohn Min  CONSULTATIONS:  Dr. Lady GaryFath  DISCHARGE DIAGNOSES:  1.  Nonsustained ventricular tachycardia. 2.  Syncope. 3.  Acute renal failure. 4.  Cardiomyopathy, with ejection fraction 20% to 30%.  5.  Systolic dysfunction.  6.  Coronary artery disease.  7.  Hypertension.   8.  Cholelithiasis.    CONDITION:  Stable.   CODE STATUS:  FULL CODE.   HOME MEDICATIONS:  Plavix 75 mg p.o. daily. Pravastatin 20 mg p.o. at bedtime.  Lopressor 25 mg p.o. b.i.d. Lisinopril 5 mg p.o. daily. Amiodarone 400 mg p.o. daily.   ACTIVITY:  As tolerated.   DIET: Low sodium, low fat, low cholesterol diet.  FOLLOWUP CARE:  Follow up with PCP within 1 to 2 weeks. Follow up with Dr. Lady GaryFath within one week.   REASON FOR ADMISSION:  Syncope and near-syncope.   HOSPITAL COURSE: The patient is an 81 year old Caucasian female with a history of CAD status post MI, with PTCA and stent placement, hypertension, TIA, hyperlipidemia, who presented to the ED with syncope episodes 3 days ago. She has had recurrent symptoms since then with near-syncope. In the ED, she was noted to have one such episode, and was found to have nonsustained V-tach associated with her symptoms. For detailed history and physical examination, please refer to the admission note dictated by Dr. Judithann SheenSparks. On admission date, the patient's EKG showed sinus rhythm with no acute ischemic changes. Tele monitor showed nonsustained V-tach. Lipase was 340. Troponin was less than 0.02. BUN 31, creatinine 1.41. The patient was admitted for nonsustained V-tach with syncope. After admission, patient has been treated with Plavix and amiodarone with Lopressor. The patient's troponin level is normal. Echocardiogram showed moderate dysfunction, with ejection fraction 20% to 30%. Dr. Lady GaryFath evaluated the patient and suggested increase amiodarone to  400 mg p.o. He also suggested  continue Lopressor and lisinopril 5 mg p.o. daily. After admission, patient has no episodes of nonsustained V-tach. Dr. Lady GaryFath suggested patient may be discharged home today and follow him as outpatient.   For acute renal failure, dehydration, patient has been treated with IV fluid. BUN decreased to 19, creatinine decreased to 1.2. The patient is clinically stable, will be discharged home today. I discussed the patient's discharge plan with the patient, Dr. Lady GaryFath, and the case manager.   TIME SPENT:  About 37 minutes.     ____________________________ Shaune PollackQing Chen, MD qc:mr D: 04/26/2012 13:28:25 ET T: 04/26/2012 18:26:02 ET JOB#: [id number]  cc: Shaune PollackQing Chen, MD, <Dictator> Shaune PollackQING CHEN MD ELECTRONICALLY SIGNED 04/27/2012 18:14

## 2014-06-17 NOTE — H&P (Signed)
PATIENT NAME:  [name], [name] MR#:  [id number] DATE OF BIRTH:  02-16-1924  DATE OF ADMISSION:  04/25/2012  REFERRING PHYSICIAN: Dr. Manson PasseyBrown in the Emergency Room.   FAMILY PHYSICIAN:  Dr. Johny ChessMoriarty.   REASON FOR ADMISSION:  Syncope and near syncope.   HISTORY OF PRESENT ILLNESS: The patient is an 81 year old female with a history of coronary artery disease, status post MI x 2 with PTCA and stent placement. Also has a history of TIAs, hypertension and hyperlipidemia. Had a syncopal episode 2 days ago. Has had recurrent symptoms since then with near syncope. Was in the Emergency Room when she had one such episode and was found to have nonsustained ventricular tachycardia associated with her symptoms. She is also complaining of abdominal pain. She is now admitted for further evaluation.   PAST MEDICAL HISTORY: 1.  ASCVD status post MI x 2.  2.  Status post PTCA with stent placement.  3.  History of TIAs.  4.  Benign hypertension.  5.  Nephrolithiasis.  6.  Hyperlipidemia.   MEDICATIONS: 1.  Nitro-Dur 0.2 mg/h topically daily.  2.  Toprol 50 mg p.o. daily.  3.  Plavix 75 mg p.o. daily.   ALLERGIES:  PENICILLIN.   SOCIAL HISTORY:  The patient is married. No history of alcohol or tobacco abuse.   FAMILY HISTORY: Positive for coronary artery disease and hypertension but otherwise unremarkable.  REVIEW OF SYSTEMS:  CONSTITUTIONAL: No fever or change in weight.  EYES: No blurred or double vision. No glaucoma.  ENT: No tinnitus or hearing loss. No nasal discharge or bleeding. No difficulty swallowing.  RESPIRATORY: No cough or wheezing. Denies hemoptysis. No painful respiration.  CARDIOVASCULAR: No chest pain or orthopnea. No palpitations.  GASTROINTESTINAL: No nausea, vomiting or diarrhea. No change in bowel habits.  GENITOURINARY:  No dysuria or hematuria. No incontinence.  ENDOCRINE: No polyuria or polydipsia. No heat or cold intolerance.  HEMATOLOGIC: The patient denies anemia, easy  bruising or bleeding.  LYMPHATIC: No swollen glands.  MUSCULOSKELETAL: The patient denies pain in her neck, back, shoulders, knees or hips. No gout.  NEUROLOGIC: No numbness or migraines. Denies seizures. Does have weakness.  PSYCHIATRIC: The patient denies anxiety, insomnia or depression.   PHYSICAL EXAMINATION: GENERAL: The patient is elderly in no acute distress.  VITAL SIGNS: Currently remarkable for blood pressure 164/70, with a heart rate of 75 and a respiratory rate of 18. She is afebrile.  HEENT: Normocephalic, atraumatic. Pupils equally round and reactive to light and accommodation. Extraocular movements are intact. Sclerae are nonicteric. Conjunctivae are clear.  Oropharynx is clear.  NECK: Supple without JVD. No adenopathy or thyromegaly is noted.  LUNGS: Essentially clear to auscultation and percussion without wheezes, rales or rhonchi. No dullness.  CARDIAC: Regular rate and rhythm with normal S1, S2. No significant rubs or gallops are present. PMI is nondisplaced. Chest wall is nontender.  ABDOMEN: Soft but diffusely tender. Normoactive bowel sounds. No rebound or guarding. No organomegaly or masses were appreciated. No hernias or bruits were noted.  EXTREMITIES: Without clubbing, cyanosis or edema. Pulses were 2+ bilaterally.  SKIN: Warm and dry without rash or lesions.  NEUROLOGIC: Revealed cranial nerves II through XII grossly intact. Deep tendon reflexes were symmetric. Motor and sensory exam is nonfocal.  PSYCHIATRIC: Revealed the patient is alert and oriented to person, place and time. She was cooperative and used good judgment.   LABORATORY DATA: EKG revealed sinus rhythm with no acute ischemic changes. Lipase was 340 with a troponin of  less than 0.02. Glucose 101 with a BUN of 31 and creatinine of 1.41 with a sodium of 140 and a potassium of 4.0. GFR was 33. White count was 7.0 with a hemoglobin of 10.3.   ASSESSMENT: 1.  Nonsustained ventricular tachycardia.  2.   Syncope.  3.  Abdominal pain.  4.  Anemia of unclear etiology.  5.  Renal insufficiency.   PLAN: The patient will be admitted to telemetry with Lovenox, beta blocker and continued nitrates. We will continue her Plavix and add amiodarone. We will follow serial cardiac enzymes and obtain an echocardiogram. We will consult cardiology for her nonsustained ventricular tachycardia. We will also obtain a CT of the abdomen because of her abdominal pain. Urinalysis is currently pending. We will hold off on antibiotics for now. Follow up routine labs in the morning. Further treatment and evaluation will depend upon [name] progress.  Total time spent on this patient was 50 minutes.   ____________________________ Duane Lope Judithann Sheen, MD jds:ce D: 04/25/2012 06:37:04 ET T: 04/25/2012 13:20:32 ET JOB#: [id number]  cc: Duane Lope. Judithann Sheen, MD, <Dictator>  Dr. Johny Chess  JEFFREY D SPARKS MD ELECTRONICALLY SIGNED 04/27/2012 8:00

## 2014-06-17 NOTE — Discharge Summary (Signed)
PATIENT NAME:  [name], [name] MR#:  [id number] DATE OF BIRTH:  May 19, 1923  DATE OF ADMISSION:  11/22/2012 DATE OF DISCHARGE:  11/26/2012  DISPOSITION: Skilled nursing facility.  DISCHARGE MEDICATIONS: 1.  Plavix 75 mg daily. 2.  Pravastatin 20 mg daily. 3.  Amiodarone 200 mg daily. 4.  Lisinopril 5 mg daily, starting on 11/28/2012.  5.  Nitroglycerin 0.4 mg as needed for chest pain. 6.  Coreg 3.125 mg twice daily. 7.  Albuterol ipratropium nebs as needed. 8.  Furosemide 20 mg twice daily, start on 10/03/ 2014. 9.  Potassium chloride 20 mEq once a day. 10.  Protonix 40 mg once a day.   DISCHARGE INSTRUCTIONS:  Follow up with Dr. Corliss Parish at [facility] in 2 weeks and Dr. Arnoldo Hooker in 2 weeks.   DISCHARGE DIAGNOSES: 1.  Acute and chronic systolic and diastolic congestive heart failure with ejection fraction 30%. 2.  Generalized weakness. 3.  Coronary artery disease   4.  Acute kidney injury stage III. 5.  Hypomagnesemia. 6.  Mild dementia.   IMPORTANT RESULTS: Creatinine 1.66 on admission and 2.9 at discharge. Lasix and lisinopril have been held because of this. The patient, since she is going to a skilled nursing facility, she could be monitored as far as her kidney function goes. Encourage regular oral hydration. Her white count was 6.3. Her hemoglobin was 10. LFTs within normal. Cardiac enzymes: Troponin was negative x 3. TSH was 2.18. Her HDL cholesterol was 67. Her magnesium was 1.5. Her LDL cholesterol was 115.   EKG: Intraseptal conduction defect with a heart rate of 80.   Her chest x-ray showed changes consistent with congestive heart failure superimposed on COPD.   Her echocardiogram was done previously and her ejection fraction was around 30%. Not echo repeated during this hospitalization.   HOSPITAL COURSE: This is a very nice 81 year old female who has history of previously diagnosed CHF with ejection fraction of 30%. She has an AICD.  She is  status post for MI x 2 and PTCA with stent placement prior.   The patient presented to the Emergency Department complaining of shortness of breath that happened acutely the day of admission.   The patient was starting to have shortness of breath 3 days prior, but not as severe and just started to get winded while doing some activity. She had some tightness of her chest, but she did not have any orthopnea or paroxysmal nocturnal dyspnea.   The patient was admitted for evaluation of the symptoms. She was evaluated in the ER. She had acute CHF exacerbation, which is systolic in nature due to her ejection fraction of 30%. She was put on oxygen. Lasix was given IV and then switched to p.o.   Lisinopril and Coreg were restarted and cardiology was consulted.   The patient has seen Dr. Gwen Pounds in the past.   As far as for Dr. Gwen Pounds, he did not have any different recommendations. The patient was continued on Lasix, continued Coreg and ACE inhibitor, Plavix and aspirin, and no further diagnosis. The patient was diuresed very nicely. She has chronic kidney disease and her baseline creatinine is around 1.6 to 2. Her discharge creatinine is 2.1. We are deciding to send her a skilled nursing facility today with close monitoring of her kidney function. We are going to hold on the Lasix and the lisinopril up until 11/28/2012 and repeat labs then.   The patient is educated about CHF.  She is very weak.  She states she cannot walk for what we had physical therapy working with her. She was able to ambulate, but she had a lot of issues with confusion and she has been imbalanced for what it was recommended for her to go to a skilled nursing facility.   TIME SPENT: About 45 minutes with this discharge.  ____________________________ Felipa Furnaceoberto Sanchez Gutierrez, MD rsg:sb D: 11/26/2012 12:39:42 ET T: 11/26/2012 12:57:36 ET JOB#: [id number]  cc: Felipa Furnaceoberto Sanchez Gutierrez, MD, <Dictator> Lamar BlinksBruce J. Kowalski, MD Corliss ParishJohn  Min, MD Pearletha FurlOBERTO SANCHEZ GUTIERRE MD ELECTRONICALLY SIGNED 12/01/2012 23:33

## 2014-06-17 NOTE — Consult Note (Signed)
   General Aspect 81 yo female with history of cad s/p pci, history of palpitaitons with svt, who apparently has a histoyr of syncope. Pt is a very difficult historian and complains of diarrhea, weakness and decreased appetite on my evaluation. She has ruled out for an mi. she has been placed on amiodarone after telemtry reveals a 6-8 beat run of wide complex tachycardia. Echo has revealed reduced lv funciton with ef of 20-30%. Pt denies shortness of breath. She denies dizziness.   Physical Exam:  GEN no acute distress   HEENT PERRL, hearing intact to voice   NECK supple   RESP clear BS   CARD Regular rate and rhythm  Normal, S1, S2   ABD denies tenderness  no hernia   LYMPH negative neck, negative axillae   EXTR negative cyanosis/clubbing   SKIN normal to palpation   NEURO cranial nerves intact, motor/sensory function intact   PSYCH A+O to time, place, person   Review of Systems:  Subjective/Chief Complaint diarrhea and apparent syncope   General: No Complaints   Skin: No Complaints   ENT: No Complaints   Eyes: No Complaints   Neck: No Complaints   Respiratory: No Complaints   Cardiovascular: No Complaints   Gastrointestinal: Diarrhea   Genitourinary: No Complaints   Vascular: No Complaints   Musculoskeletal: No Complaints   Neurologic: Fainting   Hematologic: No Complaints   Endocrine: No Complaints   Psychiatric: No Complaints   Review of Systems: All other systems were reviewed and found to be negative   Medications/Allergies Reviewed Medications/Allergies reviewed   EKG:  Abnormal NSSTTW changes    PCN: Rash  Lactose: Other   Impression Pt with history of svt and cad admitted with apparent syncopal episodes. Pt had a brief run of non sustnained wide complex tachcyardia on telemetry. Abearant svt vs vt. Pt noted to have reduced lv function which makes ventriular etiology somewhat likely. Has been placed on amiodarone. No further arrythmia  noted. Would carefully load amiodarone and follow response.   Plan 1. Amiodarone 400 mg daily 2. Continue other meds. 3. FOllow for further arrythmia 4. After load reduction   Electronic Signatures: Dalia HeadingFath, Barnes Florek A (MD)  (Signed 01-Mar-14 16:45)  Authored: General Aspect/Present Illness, History and Physical Exam, Review of System, EKG , Allergies, Impression/Plan   Last Updated: 01-Mar-14 16:45 by Dalia HeadingFath, Pharrell Ledford A (MD)

## 2014-06-17 NOTE — Consult Note (Signed)
PATIENT NAME:  [name], VENTRESS MR#:  [id number] DATE OF BIRTH:  24-Mar-1923  DATE OF CONSULTATION:  11/22/2012  CONSULTING PHYSICIAN:  Lamar Blinks, MD  REQUESTING PHYSICIAN:  Dr. Imogene Burn  REASON FOR CONSULTATION: Acute on chronic systolic dysfunction, congestive heart failure, coronary artery disease, cardiomyopathy, old myocardial infarction.   CHIEF COMPLAINT:  "I have shortness of breath."   HISTORY OF PRESENT ILLNESS:  This is an 81 year old female with known coronary artery disease, status post previous myocardial infarction, PCI in the past, with cardiomyopathy, previously, known to have an ejection fraction of 30%, with severe shortness of breath, edema, and previous history of tachycardia. The patient has had new onset of hypoxia, shortness of breath, lower extremity edema, consistent with acute on chronic systolic dysfunction congestive heart failure. She has previously been on appropriate medications for this issue, including beta blocker, amiodarone, Plavix, and statin therapy. The patient has had an elevated BNP at this time of 11, 320, with normal troponins and no evidence of myocardial infarction, and a hemoglobin of 10.5. She does have some mild chronic kidney disease. Currently she has received intravenous Lasix, with some improvement of her symptoms. The remainder of her review of systems negative for vision change, ringing in the ears, hearing loss, cough, congestion, heartburn, nausea, vomiting, diarrhea, bloody stools, stomach pain, extremity pain, leg weakness, cramping of the buttocks, known blood clots, headaches, blackouts, dizzy spells, nosebleeds, congestion, trouble swallowing, frequent urination, urination at night, muscle weakness, numbness, anxiety, depression, skin lesions, skin rashes.   PAST MEDICAL HISTORY: 1.  Heart failure.  2.  Cardiomyopathy  3.  Old myocardial infarction.  4.  Coronary artery disease.   FAMILY HISTORY: No family members with early onset of  cardiovascular disease or hypertension.   SOCIAL HISTORY:  She currently denies alcohol or tobacco use.   ALLERGIES:  As listed.  MEDICATIONS:  As listed.   PHYSICAL EXAMINATION: VITAL SIGNS:  Blood pressure is 110/68 bilaterally, heart rate is 72 upright, reclining and regular.  GENERAL: She is a well-appearing, elderly female in no acute distress.  HEAD, EYES, EARS, NOSE, THROAT: No icterus, thyromegaly, ulcers, hemorrhage or xanthelasma.  CARDIOVASCULAR: Regular rate and rhythm. Normal S1 and S2. A 2/6 apical murmur consistent with mitral regurgitation. PMI is diffuse. Carotid upstroke normal, without bruit. Jugular venous pressure is normal.  LUNGS:  Have bibasilar crackles, with normal respirations.  ABDOMEN: Soft, nontender, without hepatosplenomegaly or masses. Abdominal aorta is a normal size, without bruit.  EXTREMITIES:  Show 2+ radial, femoral, dorsal pedal pulses, with 1+ lower extremity edema. No cyanosis, clubbing, ulcers.  NEUROLOGIC:  She is oriented to time, place and person, with normal mood and affect.   ASSESSMENT:  An 81 year old female with acute on chronic systolic dysfunction, congestive heart failure with cardiomyopathy, coronary artery disease, previous myocardial infarction, anemia, without current evidence of myocardial infarction.   RECOMMENDATIONS: 1.  Continue intravenous Lasix for significant lower extremity edema, pulmonary edema, elevated BNP.  2.  Follow closely for chronic kidney disease and adjustments of medications as necessary.   3. Continue beta blocker, amiodarone for rhythm disturbances and LV systolic dysfunction.    4. Consider echocardiogram for extent of LV dysfunction changes needing adjustments of medication management.  5.  Aspirin for further risk reduction in stroke and heart attack, with known coronary artery disease.  6.  Begin ambulation wathcing for improvements of hypoxia and symptoms listed above, with no further cardiac  intervention, with no need for cardiac catheterization at this  time due to normal troponin.    ____________________________ Lamar BlinksBruce J. Everson Mott, MD bjk:mr D: 11/22/2012 18:55:18 ET T: 11/22/2012 19:30:38 ET JOB#: [id number]  cc: Lamar BlinksBruce J. Shayaan Parke, MD, <Dictator> Lamar BlinksBRUCE J Annmargaret Decaprio MD ELECTRONICALLY SIGNED 11/24/2012 10:36

## 2014-06-18 NOTE — H&P (Signed)
PATIENT NAME:  [name], Bich S MR#:  [id number] DATE OF BIRTH:  1924/01/26  DATE OF ADMISSION:  02/15/2014  REFERRING PHYSICIAN: Charlestine NightPhillip A. Scotty CourtStafford, MD  PRIMARY CARE PHYSICIAN: Alan MulderShamil J. Morayati, MD   CHIEF COMPLAINT: Weakness.   HISTORY OF PRESENT ILLNESS: An 81 year old Caucasian female with history of end-stage renal disease on hemodialysis Monday, Wednesday, Friday; coronary artery disease with ischemic cardiomyopathy, EF of 25%; as well as baseline dementia presenting with weakness. Recent discharge from [facility] on 02/15/2014 with a discharge diagnosis of respiratory distress secondary to pulmonary edema, received hemodialysis, condition improved, off supplemental oxygen, and was subsequently discharged. While at home husband noted that she had worsening weakness and was brought to the hospital for further workup and evaluation. She was, at that time, noted to be hypothermic at 92.4 degrees Fahrenheit rectal. The patient unable to provide any meaningful information given baseline dementia as well as medical condition.   REVIEW OF SYSTEMS: Unobtainable given the patient's mental status and medical condition.   PAST MEDICAL HISTORY: Ischemic cardiomyopathy, ejection fraction of 25%; history of AICD placement; coronary artery disease of native vessels; ; end-stage renal disease on hemodialysis Monday, Wednesday, Friday; dementia at baseline; TIA; hypertension, essential; hyperlipidemia, unspecified.   SOCIAL HISTORY: No alcohol, tobacco, or drug use documented.   FAMILY HISTORY: Positive for coronary artery disease.   ALLERGIES: PENICILLIN.   HOME MEDICATIONS: Include meloxicam 7.5 mg p.o. daily, Percocet 5/325 mg p.o. q.4 hours as needed for pain, lisinopril 5 mg p.o. daily, amiodarone 200 mg p.o. daily, clonazepam 0.5 mg p.o. b.i.d. as needed for anxiety, Plavix 75 mg p.o. daily, hydroxyzine/hydrochloride 10 mg/5 mL 10 mL every 6 hours as needed for itching, Coreg 3.125 mg p.o.  b.i.d., donepezil 5 mg p.o. at bedtime, ferrous gluconate 324 mg 2 tablets p.o. daily.   PHYSICAL EXAMINATION:  VITAL SIGNS: Temperature 92.4, heart rate 48, respirations of 18, blood pressure 120/47, saturating 99% on room air. Weight 46.3 kg, BMI of 16.57.  GENERAL: Pleasantly demented Caucasian female who is ill-appearing.  HEAD: Normocephalic, atraumatic.  EYES: Pupils equal, round, react to light. Extraocular muscles intact. No scleral icterus.  MOUTH: Dry mucosal membrane. Dentition intact. No abscess noted.  EAR, NOSE, THROAT: Clear without exudate. No external lesions.  NECK: Supple. No thyromegaly. No nodules. No JVD.  PULMONARY: Diminished breath sounds throughout all lung fields secondary to poor respiratory effort.  CHEST: Nontender to palpation.  CARDIOVASCULAR: S1, S2, regular rate and rhythm. No murmurs, rubs, or gallops. No edema. Pedal pulses 2+ bilaterally.  GASTROINTESTINAL: Soft, nontender, nondistended. No masses. Positive bowel sounds. No hepatosplenomegaly. MUSCULOSKELETAL: No swelling, clubbing, or edema. Passive range of motion full in all extremities.  NEUROLOGIC: Cranial nerves II-XII intact. No gross focal neurologic deficits. Sensation intact. Reflexes intact.  SKIN: No ulcerations, lesions, rashes, or cyanosis. Skin warm and dry. Turgor intact.  NEUROLOGICAL: Unable to fully assess given patient's mental status and medical condition. She is unable to follow simple commands at this time.  PSYCHIATRIC: Unable to fully assess given patient's mental status. She is responsive to verbal stimuli; however, unable to follow commands or answer questions appropriately.   LABORATORY DATA: Sodium of 135, potassium 3.7, chloride 98, bicarbonate of 28, BUN 18, creatinine 2.44, glucose 109, albumin of 2.8, AST of 44, otherwise LFTs within normal limits. Troponin 0.04. WBC of 4.2, hemoglobin 10.5, platelets 216,000. Urinalysis negative for evidence of infection. Lactic acid of 1.3.  Had a chest x-ray prior to discharge on December 22 performed at  7:00 a.m., which revealed mild opacification of left retrocardiac region consistent with some atelectasis or infection.   ASSESSMENT AND PLAN: An 81 year old Caucasian female with history of end-stage renal disease on hemodialysis; ischemic cardiomyopathy, ejection fraction of 25%; dementia, presenting with weakness, recently discharged from the hospital with respiratory distress secondary to pulmonary edema.  1.  Hypothermia initial visit question whether related to sepsis. No clear etiology at this time. We will repeat chest x-ray now. Panculture. Broad spectrum antibiotics; given her penicillin allergy we will give vancomycin as well as aztreonam and Levaquin. We will hold further intravenous fluid hydration at this time given end-stage renal disease on hemodialysis. Continue with warming blanket and Bear Hugger as required to maintain temperatures.  2.  Hypertension: Continue lisinopril and Coreg.  3.  End-stage renal disease on hemodialysis Monday, Wednesday, Friday. We will consult nephrology for continuation of dialysis.  4.  Dementia: Continue with Aricept.  5.  Venous thromboembolism prophylaxis: Heparin subcutaneous.   CODE STATUS: The patient is full code.   TIME SPENT: 45 minutes critical care.   ____________________________ Cletis Athens. Estaban Mainville, MD dkh:bm D: 02/16/2014 00:47:00 ET T: 02/16/2014 01:22:53 ET JOB#: [id number]  cc: Cletis Athens. Miyonna Ormiston, MD, <Dictator> Lathyn Griggs Synetta Shadow MD ELECTRONICALLY SIGNED 02/16/2014 13:44

## 2014-06-18 NOTE — Consult Note (Signed)
Psychiatry: PAtient seen and chart reviewed. PAtient reports being more nervous and anxious. Husband was not here today to reassure her. Affect nervous but not panicy. Insight good. Slept better. Reassureance and supportive therapy and education.No change to medication today.  Patient looks forward to discharge.  Electronic Signatures: Audery Amellapacs, John T (MD)  (Signed on 26-Nov-15 23:08)  Authored  Last Updated: 26-Nov-15 23:08 by Audery Amellapacs, John T (MD)

## 2014-06-18 NOTE — Discharge Summary (Signed)
PATIENT NAME:  [name], [name] MR#:  [name] DATE OF BIRTH:  1923-09-17  DATE OF ADMISSION:  02/15/2014 DATE OF DISCHARGE:  02/17/2014  PRESENTING COMPLAINT: Difficulty breathing.   PRIMARY CARE PHYSICIAN: Alan MulderShamil J. Morayati, MD  PRIMARY NEPHROLOGIST: Mosetta PigeonHarmeet Singh, MD  DISCHARGE DIAGNOSES: 1.  Pneumonia. 2.  Chronic congestive heart failure, systolic.  3.  End-stage renal disease, on hemodialysis.  4.  Saturations 99% on room air.   MEDICATIONS:  1.  Plavix 75 mg p.o. daily.  2.  Amiodarone 200 mg daily.  3.  Lisinopril 5 mg daily.  4.  Carvedilol 3.125 b.i.d.  5.  Meloxicam 7.5 mg daily.  6.  Donepezil 5 mg daily at bedtime.  7.  Ferrous gluconate 324 mg 2 tablets daily.  8.  Clonazepam 0.5 mg 1 tablet b.i.d. as needed.  9.  Hydroxyzine hydrochloride 10 mL 4 times a day as needed.  10.  Levaquin 250 p.o. daily.   DISCHARGE INSTRUCTIONS:  1.  Followup with Alan MulderShamil J. Morayati, MD in 1 to 2 weeks.  2.  Resume hemodialysis on Monday, Wednesday, Friday. 3.  Renal diet.  CONSULTATIONS: Nephrology consultation with Lennox PippinsMunsoor N. Lateef, MD.  BRIEF SUMMARY OF HOSPITAL COURSE: [name] is an 81 year old Caucasian female who has past medical history of systolic congestive heart failure, dementia, hypertension, CAD, history of CVA and end-stage renal disease who was started on hemodialysis since last month, comes in with:  1.  Acute respiratory distress, suspected due to pneumonia with infiltrate seen on x-ray. Blood cultures were negative. The patient will finish up a course of Levaquin. No fever. Temperature is okay. Saturations were more than 92% on room air.  2.  End-stage renal disease on hemodialysis Monday, Wednesday, Friday. [name] inpatient dialysis was continued. She had ultrafiltration of 1200 mL on the day of discharge.  3.  Systolic congestive heart failure, well compensated.  4.  CAD, stable. Continued cardiac medications.  5.  Dementia, at baseline on Aricept.    [name] husband refused home health during the last admission. He is agreeable with it this time. Care management assisted in home health arrangement. Hospital stay otherwise remained stable.   CODE STATUS: The patient remained a full code.   TIME SPENT: Forty minutes.    ____________________________ Wylie HailSona A. Allena KatzPatel, MD sap:TT D: 02/21/2014 16:16:51 ET T: 02/21/2014 20:23:30 ET JOB#: [id number]  cc: Katoria Yetman A. Allena KatzPatel, MD, <Dictator> Alan MulderShamil J. Morayati, MD Mosetta PigeonHarmeet Singh, MD Willow OraSONA A Sakura Denis MD ELECTRONICALLY SIGNED 02/22/2014 18:39

## 2014-06-18 NOTE — Discharge Summary (Signed)
PATIENT NAME:  Suzanne HalesMILLER, Shalin S MR#:  [id number] DATE OF BIRTH:  12/07/1923  DATE OF ADMISSION:  01/10/2014 DATE OF DISCHARGE:  01/21/2014  ADMITTING DIAGNOSES:  1.  Systolic congestive heart failure, acute on chronic.  2.  Chronic kidney disease, now progressed to end-stage renal disease on hemodialysis.  3.  Acute bronchitis.  4.  Debility.  5.  Dementia.  6.  Anemia of chronic disease.  7.  Chest pain with history of coronary artery disease with history of myocardial infarction in the past.  8.  Anemia of chronic disease, status post treatment with Procrit.  9.  Cardiomyopathy with ejection fraction of 30%.  10.  History of transient ischemic attack.  11.  Previous history of cardiac stent.  12.  Hypertension.  13.  Nephrolithiasis.  14.  Hyperlipidemia.  15.  Cholelithiasis.   CONSULTANTS: Dr. Thedore MinsSingh of nephrology, palliative care, Dr. Wyn Quakerew.  PROCEDURES:  Fluoroscopic guidance for vascular access for right internal jugular vein.   PERTINENT LABS AND EVALUATIONS:  Admitting glucose 151, BUN 36, creatinine 3.67, sodium 144, potassium 4.1, CO2 was 108, calcium was 8.5. WBC 6.3, hemoglobin 9.4, platelet count was 256,000.  EKG normal sinus rhythm with left bundle branch block. Chest x-ray:  COPD and chronic vascular changes.   HOSPITAL COURSE: Please refer to H and P done by the admitting physician. The patient is an 81 year old white female who was hospitalized with shortness of breath and chest pain. The patient was initially diuresed without significant results. Therefore, nephrology was consulted.  They recommended starting patient on dialysis. The patient had a temporary catheter placed by vascular. The patient tolerated the procedure well and has been dialyzed a few times. She was also seen by Dr. Toni Amendlapacs in view of her dementia and agitation, who recommended some Aricept. The patient is doing well from that standpoint as well. She also had bronchitis and COPD with acute disease  exacerbation, which was treated with steroids and nebulizers. Outpatient dialysis has been set up. Discharge instructions for CHF given.   DISCHARGE MEDICATIONS: Plavix 75 p.o. daily, pravastatin 20 at bedtime, amiodarone 200 daily, lisinopril 5 daily, carvedilol 3.125 one tab p.o. b.i.d., Protonix 40 daily, Percocet 5/325 q. 4 p.r.n., torsemide 5 mg daily, meloxicam 7.5 p.o. daily, iron gluconate 324, 2 tabs daily, Donezepil  5 mg at bedtime.  DIET:  Renal diet.   ACTIVITY: As tolerated. Home health services and nurse and nurse aide.  Timeframe for followup, follow with primary M.D. in 1 to 2 weeks.  TIME SPENT ON THIS DISCHARGE: 35 minutes.   ____________________________ Lacie ScottsShreyang H. Allena KatzPatel, MD shp:DT D: 01/22/2014 12:36:35 ET T: 01/22/2014 14:41:12 ET JOB#: [id number]  cc: Shiva Karis H. Allena KatzPatel, MD, <Dictator> Charise CarwinSHREYANG H Wyman Meschke MD ELECTRONICALLY SIGNED 01/23/2014 13:37

## 2014-06-18 NOTE — Consult Note (Signed)
PATIENT NAME:  [name], PYATT MR#:  [id number] DATE OF BIRTH:  09-Feb-1924  DATE OF CONSULTATION:  01/18/2014  CONSULTING PHYSICIAN:  Audery Amel, MD  IDENTIFYING INFORMATION AND REASON FOR CONSULTATION: An 81 year old woman with a history of heart failure, currently in the hospital starting hemodialysis. Consult for "dementia and agitation."   HISTORY OF PRESENT ILLNESS: Information obtained from the patient, from the chart, and from the patient's husband. The nursing staff also assisted. Current nursing staff told me that last night the patient was agitated trying to get out of bed, unable to sleep, but that a dose of Benadryl was given and was highly effective and helped her sleep well. On interview today, the patient states that her mood is feeling good. She denies any depression. Denies having any hallucinations. She does say that she has had trouble sleeping while she is in the hospital. When she is at home she frequently has trouble sleeping and will take over-the-counter sleep aids. She also reports that she is aware that she has had progressive memory loss that has been getting more noticeable over at least the last 2 years. Her husband confirms this. They both report that her long-term memory is intact, but that short-term memory has been clearly more impaired. The patient is not abusing any substances. Does not have any acute depressive symptoms. Has a positive outlook and is looking forward to getting discharged. She has some insight into the fact that she has been sundowning and getting agitated at night in the hospital.   PAST PSYCHIATRIC HISTORY: The patient and her husband both recall that several decades ago she had an inpatient psychiatric hospitalization at [facility]. They are at something of a loss to describe it to me, but eventually agreed that it had something to do with anxiety. Evidently there was no further illness after that and she has not had any psychiatric care in decades. No  history of depression. No history of suicidality. No history of substance abuse.   SOCIAL HISTORY: The patient lives at home with her husband, the 2 of them have been married for 60 years. They appear to have a very good relationship.   PAST MEDICAL HISTORY: The patient has congestive heart failure and now has renal failure and has had to start hemodialysis and will need to continue hemodialysis when she goes home.   FAMILY HISTORY: Knows of no family history of mental illness.   CURRENT MEDICATIONS: Amiodarone 200 mg per day, Coreg 3.125 mg twice a day, Plavix 75 mg a day, Lasix 40 mg a day, pravastatin 20 mg at night, pantoprazole 40 mg a day, senna once b.i.d. p.r.n.  It looks like she has been able to stop most of the pain medicine. Docusate 100 mg a day.   ALLERGIES: PENICILLIN.   REVIEW OF SYSTEMS: Denies depression. Denies suicidal ideation. Denies hallucinations. Does have mild anxiety, but it is under control. No panic attacks. No obsessive worry. The patient is aware of having progressive short-term memory loss.   MENTAL STATUS EXAMINATION: Extremely pleasant, cooperative, and forthcoming woman, who looks her stated age or younger, Good eye contact. Psychomotor activity normal. Very appropriate and cooperative in the interview. Speech normal rate, tone, and volume. Affect appropriate, reactive, euthymic. Mood stated as being a little nervous. Thoughts are lucid and organized. No sign of delusional thinking or loosening of associations. Denies suicidal or homicidal ideation. Denies hallucinations.  On cognitive testing, the patient does show clear deficits. She could repeat 3 words only  after several attempts and could not remember any of them after 5 minutes. She was not able to do a figure drawing at all, but she was able to write a very coherent and appropriate sentence. Full Mini-Mental Status Exam. she scored a 20 out of 30 indicating a definite, but mild degree of dementia. Insight and  judgment good.   LABORATORY RESULTS: The creatinine is 2.35, BUN 34, chloride slightly elevated, calcium low, 8.3, albumin low at 2.6. All consistent with her medical condition. Hematocrit low at 25.6. Nothing else really significant to psychiatric condition.   ASSESSMENT: This is an 81 year old woman with a past psychiatric history, but nothing really acute, who has been having some progressive memory loss. On exam, she has a mild, but definite degree of dementia, which she and her husband report has been present and progressive for some time. She has had some sundowning in the hospital, which has been managed with several different medicines on several nights. Currently she is not delirious, and she is cooperative with treatment. She would probably benefit from treatment for the dementia as well as something mild to help with sleep tonight.   TREATMENT PLAN: Psychoeducation done with the patient and reviewed all of my findings and conclusions. The patient was quite accepting and grateful for this. I recommend a 25 mg oral Benadryl be used p.r.n. for sleep or agitation tonight and only if that does not work, she can have IV Haldol 1 mg q. 6 hours p.r.n. I am going to start her on Aricept 5 mg at night with an understanding that this should be continued after discharge and be increased to 10 mg in a few weeks if she tolerates it well. I reviewed all this with the patient.   DIAGNOSIS, PRINCIPAL AND PRIMARY:  AXIS I: Dementia, Alzheimer's type.   SECONDARY DIAGNOSIS: AXIS I: Delirium due to hospital treatment (sundowning).    ____________________________ Audery AmelJohn T. Clapacs, MD jtc:LT D: 01/18/2014 18:03:03 ET T: 01/18/2014 18:35:16 ET JOB#: [id number]  cc: Audery AmelJohn T. Clapacs, MD, <Dictator> Audery AmelJOHN T CLAPACS MD ELECTRONICALLY SIGNED 01/21/2014 14:46

## 2014-06-18 NOTE — H&P (Signed)
PATIENT NAME:  [name], [name] MR#:  [id number] DATE OF BIRTH:  03-Sep-1923  DATE OF ADMISSION:  01/10/2014  PRIMARY CARE PHYSICIAN:  Alan Mulder, MD   REFERRING PHYSICIAN:  [place] Sink. Dolores Frame, MD   CHIEF COMPLAINT:  Chest pain and shortness of breath.   HISTORY OF PRESENT ILLNESS:  [name] is an 81 year old female with a history of cardiomyopathy with EF of 30% status post AICD placement and history of coronary artery disease status post stent placement, who presented to the Emergency Department with the complaints of sudden onset of chest tightness and shortness of breath while the patient was lying in the bed. Concerning this, the patient was brought to the Emergency Department. On workup in the Emergency Department, the patient was diffusely wheezing and hypoxic with oxygen saturations in the high 80s. The patient was initiated on oxygen. The patient denies having any cough prior to that; however, both wife and husband are poor historians. Denies having any fever. Initial set of cardiac enzymes is negative. The patient has chronic renal insufficiency. Even though the patient has elevated BNP, it does not have a big significance. The patient states that she was noted to have mild increase in lower extremity swelling. The patient received breathing treatments in the Emergency Department and was also given one dose of Lasix IV.   PAST MEDICAL HISTORY: 1.  Cardiomyopathy with EF of 30%.  2.  Coronary artery disease status post MI and stent placement.  3.  TIA.  4.  Hypertension.  5.  Nephrolithiasis.  6.  Hyperlipidemia.  7.  Cholelithiasis diagnosed about 4 days back.   ALLERGIES:  PENICILLIN AND LACTOSE.   HOME MEDICATIONS: 1.  Torsemide 5 mg once a day.  2.  Pravastatin 20 mg once a day.  3.  Percocet 5/325 mg every 4 hours as needed.  4.  Protonix 40 mg once a day.  5.  Meloxicam 7.5 mg once a day.  6.  Lisinopril 5 mg once a day.  7.  Ferrous sulfate 2 tablets daily.  8.   Plavix 75 mg once a day.  9.  Coreg 3.125 mg 2 times a day.  10.  Amiodarone 200 mg once a day.   SOCIAL HISTORY:  No history of smoking, drinking alcohol, or using illicit drugs. Married and lives with her husband.   FAMILY HISTORY:  Coronary artery disease and hypertension.   REVIEW OF SYSTEMS: CONSTITUTIONAL:  Denies any generalized weakness.  EYES:  No change in vision.  EARS, NOSE, AND THROAT:  No change in hearing.  RESPIRATORY:  Has cough and shortness of breath.  CARDIOVASCULAR:  Has chest pain and shortness of breath.  GASTROINTESTINAL:  No nausea, vomiting, or abdominal pain.  GENITOURINARY:  No dysuria or hematuria.  SKIN:  No rash or lesions.  MUSCULOSKELETAL:  No joint pains and aches.  NEUROLOGIC:  No weakness or numbness in any part of the body.  ENDOCRINE:  No polyuria or polydipsia.   PHYSICAL EXAMINATION: GENERAL:  This is a frail-looking, age-appropriate female lying down in the bed, not in distress.  VITAL SIGNS:  Temperature is 97.6, pulse 75, blood pressure 160/84, respiratory rate 18, and oxygen saturation is 95% on 2 liters of oxygen.  HEENT:  Head is normocephalic and atraumatic. There is no scleral icterus. Conjunctivae are normal. Pupils are equal and react to light. Mucous membranes are moist. No pharyngeal erythema.  NECK:  Supple. No lymphadenopathy. No JVD. No carotid bruit. No thyromegaly.  CHEST:  Has no focal tenderness. Bilateral wheezing and some rhonchi.  HEART:  S1, S2 regular. No murmurs are heard.  ABDOMEN:  Bowel sounds present. Soft, nontender, nondistended. No hepatosplenomegaly.  EXTREMITIES:  There is 1+ pitting edema around the ankles.  SKIN:  No rash or lesions.  MUSCULOSKELETAL:  Good range of motion in all of the extremities.  NEUROLOGIC:  The patient is alert and oriented to place, person, and time. Cranial nerves II through XII are intact. Motor is 5/5 in upper and lower extremities.   LABORATORY DATA AND IMAGING RESULTS:  BMP:  BUN  48, creatinine 3.95. BNP is 25,000. The rest of all the values are within normal limits. CBC:  WBC 6.2, hemoglobin 8.9, and platelet count 247,000.   Chest x-ray, one view, portable:  COPD with chronic vascular congestion and bibasilar atelectasis.   ASSESSMENT AND PLAN:  [name] is an 81 year old female with a known history of congestive heart failure, who comes with chest pain and shortness of breath.   1.  Congestive heart failure, systolic, acute on chronic. Continue with the diuresis. Continue with the lisinopril and the Coreg.  2.  Chest pain. Initial set of cardiac enzymes is negative. The patient has a known history of coronary artery disease.  3.  Underlying bronchitis. We will also give breathing treatments and prednisone. No indication for any antibiotics.  4.  Debility: We will involve physical therapy.  5.  Keep the patient on deep vein thrombosis prophylaxis with Lovenox.   TIME SPENT:  50 minutes.    ____________________________ Susa GriffinsPadmaja Emer Onnen, MD pv:nb D: 01/10/2014 05:56:51 ET T: 01/10/2014 06:44:14 ET JOB#: [id number]  cc: Susa GriffinsPadmaja Infant Doane, MD, <Dictator> Susa GriffinsPADMAJA Cloa Bushong MD ELECTRONICALLY SIGNED 01/21/2014 22:38

## 2014-06-18 NOTE — Consult Note (Signed)
Psychiatry: Follow-up for this patient who had some minor agitation and difficulty sleeping.  Today I find her awake and alert and oriented.  Pleasantly conversant.  Good eye contact normal psychomotor activity.  She does not remember my name but has insight into it and remembers having met me before.  Says that she didn't sleep well last night and is concerned about that. level of confusion and dementia seems about the same as yesterday.  I agree with her that we want to make sure she sleeps well tonight.  I will try changing the Benadryl to perhaps some when necessary Ambien to help with sleep tonight.  Will follow-up over the weekend as needed.  No other change to medication for now.  Electronic Signatures: Clapacs, Madie Reno (MD)  (Signed on 25-Nov-15 17:36)  Authored  Last Updated: 25-Nov-15 17:36 by Gonzella Lex (MD)

## 2014-06-18 NOTE — Op Note (Signed)
PATIENT NAME:  [name], [name] MR#:  [id number] DATE OF BIRTH:  [birth year]  DATE OF PROCEDURE:  01/14/2014  PREOPERATIVE DIAGNOSIS:  End-stage renal disease.   POSTOPERATIVE DIAGNOSIS:  End-stage renal disease.   PROCEDURES: 1.  Ultrasound guidance for vascular access to right internal jugular vein.  2.  Fluoroscopic guidance for placement of catheter.  3. Placement of a 19 cm tip-to-cuff tunneled hemodialysis catheter via the right internal jugular vein.   SURGEON:  Annice NeedyJason S. Dew, MD   ANESTHESIA: Local with sedation.   BLOOD LOSS: Minimal.   INDICATION FOR PROCEDURE: An 81 year old white female with known severe chronic kidney disease has now progressed to end-stage renal failure. She requires a dialysis catheter for initiation of dialysis at this hospitalization. Risks and benefits were discussed. Informed consent was obtained.   DESCRIPTION OF THE PROCEDURE: The patient was brought to the vascular and interventional radiology suite. The patient's right neck and chest were sterilely prepped and draped and a sterile surgical field was created. The right internal jugular vein was visualized with ultrasound and found to be patent. It was then accessed under direct ultrasound guidance and a permanent image was recorded. A wire was placed. After a skin nick and dilatation, the peel-away sheath was placed over the wire.   I then turned my attention to an area under the clavicle. Approximately 2 fingerbreadths below the clavicle a small counter incision was created and we tunneled from the subclavicular incision to the access site. Using fluoroscopic guidance, a 19 cm tip-to-cuff tunneled hemodialysis catheter was selected, tunneled from the subclavicular incision to the access site. It was then placed through the peel-away sheath and the peel-away sheath was removed. The catheter tips were parked in the right atrium. The appropriate distal connectors were placed. It withdrew blood well and flushed  easily with heparinized saline and a concentrated heparin solution was then placed. It was secured to the chest wall with 2 Prolene sutures. The access incision was closed with a single 4-0 Monocryl. A 4-0 Monocryl pursestring suture was placed around the exit site. Sterile dressings were placed.   The patient tolerated the procedure well and was taken to the recovery room in stable condition.     ____________________________ Annice NeedyJason S. Dew, MD jsd:at D: 01/14/2014 08:57:01 ET T: 01/14/2014 14:42:33 ET JOB#: [id number]  cc: Annice NeedyJason S. Dew, MD, <Dictator> Annice NeedyJASON S DEW MD ELECTRONICALLY SIGNED 02/06/2014 11:42

## 2014-06-18 NOTE — Consult Note (Signed)
CHIEF COMPLAINT and HISTORY:  Subjective/Chief Complaint progression of CKD to ESRD   History of Present Illness Patient is admitted with worsening SOB and bronchitis.  She has known CKD which is advanced and now has felt to have progressed to ESRD.  She needs to start dialysis and we are asked to place a Permcath since she is now ESRD.  She still has some SOB but this is better.  No lethargy, some decreased appetite and itching.  Has never had dialysis before now.   PAST MEDICAL/SURGICAL HISTORY:  Past Medical History:   scarlet fever:    Glaucoma:    TIA:    MI x2:    Hypercholesterolemia:    HTN:    kidney stone:    lithrotripsy:   ALLERGIES:  Allergies:  PCN: Rash  HOME MEDICATIONS:  Home Medications: Medication Instructions Status  Percocet 5/325 oral tablet 1 tab(s) orally every 4 hours, As Needed - for Pain Active  lisinopril 5 mg oral tablet 1 tab(s) orally once a day start 11/28/12 Active  carvedilol 3.125 mg oral tablet 1 tab(s) orally 2 times a day Active  pantoprazole 40 mg oral delayed release tablet 1 tab(s) orally once a day Active  torsemide 5 mg oral tablet 1 tab(s) orally once a day Active  ferrous gluconate 324 mg oral tablet 2 tab(s) orally once a day Active  meloxicam 7.5 mg oral tablet 1 tab(s) orally once a day Active  clopidogrel 75 mg oral tablet 1 tab(s) orally once a day with breakfast Active  pravastatin 20 mg oral tablet 1 tab(s) orally once a day (at bedtime) Active  amiodarone 200 mg oral tablet 1 tab(s) orally once a day Active   Family and Social History:  Family History Non-Contributory   Social History negative tobacco, negative ETOH, negative Illicit drugs   Place of Living Home   Review of Systems:  Subjective/Chief Complaint No TIA/stroke/seizure No heat or cold intolerance No dysuria/hematuria No blurry or double vision No tinnitus or ear pain No rashes or ulcer   Fever/Chills No   Cough Yes   Sputum No    Abdominal Pain No   Diarrhea No   Constipation No   Nausea/Vomiting No   SOB/DOE Yes   Chest Pain No   Telemetry Reviewed NSR   Dysuria No   Tolerating PT Yes   Tolerating Diet Yes   Medications/Allergies Reviewed Medications/Allergies reviewed   Physical Exam:  GEN no acute distress, thin, elderly   HEENT pink conjunctivae, moist oral mucosa   NECK No masses  trachea midline   RESP normal resp effort  no use of accessory muscles   CARD regular rate  no JVD   VASCULAR ACCESS none   ABD denies tenderness  soft   GU no superpubic tenderness   LYMPH negative neck, negative axillae   EXTR negative cyanosis/clubbing, negative edema   SKIN normal to palpation, skin turgor good   NEURO cranial nerves intact, motor/sensory function intact   PSYCH alert, A+O to time, place, person   LABS:  Laboratory Results: General Ref:    17-Nov-15 16:27, ANA w/Reflex to 9 Conf.Tests  ANA w/Reflex to 9 Conf.Tests   ========== TEST NAME ==========  ========= RESULTS =========  = REFERENCE RANGE =    ANA W/REFLEX-9 CONF.TEST    ANA w/Reflex if Positive  ANA Direct                      [   Negative             ]  Negative                  [facility]            No: [id number]            [address], [place], [place] [address]            Lindon Romp, MD         [phone]     Result(s) reported on 13 Jan 2014 at 08:36AM.    17-Nov-15 16:27, Parathyroid Hormone, Intact  Parathyroid Hormone, Intact   ========== TEST NAME ==========  ========= RESULTS =========  = REFERENCE RANGE =    PTH INTACT    PTH, Intact  PTH, Intact                     [facility]  169 pg/mL            ]             [address]                  [facility]            No: [id number]            [address], [place], [place] [address]            Lindon Romp, MD         [phone]     Result(s) reported on 12 Jan 2014 at 10:19AM.    17-Nov-15 16:27, Protein  Electrophoresis, Serum  Protein Electrophoresis, Serum   ========== TEST NAME ==========  ========= RESULTS =========  = REFERENCE RANGE =    PROTEIN ELECTROPHORESIS    Protein Electro.,S  Protein, Total, Serum           [   6.2 g/dL             ]           6.0-8.5  Albumin                         [   3.3 g/dL             ]           3.2-5.6  Alpha-1-Globulin                [   0.3 g/dL             ]           0.1-0.4  Alpha-2-Globulin                [   0.8 g/dL             ]           0.4-1.2  Beta Globulin                   [   0.8 g/dL             ]           0.6-1.3  Gamma Globulin                  [   1.0 g/dL             ]           0.5-1.6  M-Spike                         [  Not Observed g/dL    ]      Not Observed  Globulin, Total                 [   2.9 g/dL             ]           2.0-4.5  A/G Ratio                       [   1.1                  ]           0.7-2.0  Please note:                    [   Final Report         ]                    Protein electrophoresis scan will follow via computer, mail, or  courier delivery.                [facility]            No: [id number]            [address], [place], [place] [address]            Lindon Romp, MD         [phone]     Result(s) reported on 12 Jan 2014 at 04:49PM.    17-Nov-15 16:27, Vitamin D, 25-Hydroxy, Serum  Vitamin D, 25-Hydroxy, Serum   ========== TEST NAME ==========  ========= RESULTS =========  = REFERENCE RANGE =    VITAMIN D    Vitamin D, 25-Hydroxy  Vitamin D, 25-Hydroxy           [   33.1 ng/mL           ]        30.0-100.0  Vitamin D deficiency has been defined by the [place] practice guideline as a  level of serum 25-OH vitamin D less than 20 ng/mL (1,2).  The Endocrine Society went on to further define vitamin D  insufficiency as a level between 21 and 29 ng/mL (2).  1. IOM (Institute of Medicine). 2010. Dietary reference      intakes for calcium and D. [place]: The     [employer].  2. Holick MF, Binkley [place], Bischoff-Ferrari HA, et al.     Evaluation, treatment, and prevention of vitamin D     deficiency: an Endocrine Society clinical practice     guideline. JCEM. 2011 Jul; 96(7):1911-30.                  [facility]            No: [id number]            [address], [place], [place] [address]            Lindon Romp, MD         [phone]     Result(s) reported on 12 Jan 2014 at 04:49PM.    17-Nov-15 18:16, Eosinophil, Urine  Eosinophil, Urine   ========== TEST NAME ==========  ========= RESULTS =========  = REFERENCE RANGE =    EOSINOPHIL, URINE    Eosinophil, Urine  Eosinophil, Urine               [  No Eosinophils Seen %]                     <5% few or none seen                  [facility]            No: [id number]            [address] [place], [place] [address]            Lindon Romp, MD         [phone]     Result(s) reported on 13 Jan 2014 at 12:17PM.  Cardiology:    16-Nov-15 00:19, ED ECG  Ventricular Rate 82  Atrial Rate 82  P-R Interval 166  QRS Duration 158  QT 442  QTc 516  P Axis 8  R Axis -23  T Axis 13  ECG interpretation   Normal sinus rhythm  Left bundle branch block  Abnormal ECG  When compared with ECG of 06-Jan-2014 04:22,  Nonspecific T wave abnormality, worse in Lateral leads  ----------unconfirmed----------  Confirmed by OVERREAD, NOT ([id number]), editor PEARSON, BARBARA ([id number]) on 01/10/2014 2:40:33 PM  ED ECG   Routine Chem:    16-Nov-15 28:31, Basic Metabolic Panel (w/Total Calcium)  Glucose, Serum 157  BUN 48  Creatinine (comp) 3.95  Sodium, Serum 141  Potassium, Serum 5.0  Chloride, Serum 106  CO2, Serum 28  Calcium (Total), Serum 8.1  Anion Gap 7  Osmolality (calc) 297  eGFR (African American) 14  eGFR (Non-African American) 11  eGFR values <71m/min/1.73 m2 may be an indication of  chronic  kidney disease (CKD).  Calculated eGFR, using the MRDR Study equation, is useful in   patients with stable renal function.  The eGFR calculation will not be reliable in acutely ill patients  when serum creatinine is changing rapidly. It is not useful in  patients on dialysis. The eGFR calculation may not be applicable  to patients at the low and high extremes of body sizes, pregnant  women, and vegetarians.    16-Nov-15 00:31, B-Type Natriuretic Peptide ([facility]  B-Type Natriuretic Peptide ([facility] [phone] Result(s) reported on 10 Jan 2014 at 01:01AM.    17-Nov-15 060:73 Basic Metabolic Panel (w/Total Calcium)  Glucose, Serum 90  BUN 54  Creatinine (comp) 4.03  Sodium, Serum 141  Potassium, Serum 4.2  Chloride, Serum 105  CO2, Serum 29  Calcium (Total), Serum 8.1  Anion Gap 7  Osmolality (calc) 296  eGFR (African American) 13  eGFR (Non-African American) 11  eGFR values <655mmin/1.73 m2 may be an indication of chronic  kidney disease (CKD).  Calculated eGFR, using the MRDR Study equation, is useful in   patients with stable renal function.  The eGFR calculation will not be reliable in acutely ill patients  when serum creatinine is changing rapidly. It is not useful in  patients on dialysis. The eGFR calculation may not be applicable  to patients at the low and high extremes of body sizes, pregnant  women, and vegetarians.    17-Nov-15 16:27, Phosphorus, Serum  Phosphorus, Serum 3.8  Result(s) reported on 11 Jan 2014 at 05:04PM.    18-Nov-15 0471:06Basic Metabolic Panel (w/Total Calcium)  Glucose, Serum 100  BUN 65  Creatinine (comp) 4.48  Sodium, Serum 137  Potassium, Serum 4.6  Chloride, Serum 102  CO2, Serum 28  Calcium (Total), Serum 8.6  Anion Gap 7  Result(s) reported on 12 Jan 2014 at 05:26AM.  Osmolality (calc) 293    19-Nov-15 16:10, Basic Metabolic Panel (w/Total Calcium)  Glucose, Serum 94  BUN 75  Creatinine (comp) 4.35  Sodium, Serum 141   Potassium, Serum 5.0  Chloride, Serum 106  CO2, Serum 28  Calcium (Total), Serum 8.4  Anion Gap 7  Osmolality (calc) 303  eGFR (African American) 12  eGFR (Non-African American) 10  eGFR values <28m/min/1.73 m2 may be an indication of chronic  kidney disease (CKD).  Calculated eGFR, using the MRDR Study equation, is useful in   patients with stable renal function.  The eGFR calculation will not be reliable in acutely ill patients  when serum creatinine is changing rapidly. It is not useful in  patients on dialysis. The eGFR calculation may not be applicable  to patients at the low and high extremes of body sizes, pregnant  women, and vegetarians.  Misc Urine Chem:    17-Nov-15 18:16, Protein/Creatinine Ratio, Ur Random  Creatinine, Urine 43.2  Protein, Random Urine 9  Protein/Creat Ratio (comp) 208  Result(s) reported on 11 Jan 2014 at 10:36PM.  Cardiac:    16-Nov-15 00:31, CPK-MB, Serum  CPK-MB, Serum 1.3  Result(s) reported on 10 Jan 2014 at 03:32AM.    16-Nov-15 00:31, Troponin I  Troponin I 0.04  0.00-0.05  0.05 ng/mL or less: NEGATIVE   Repeat testing in 3-6 hrs   if clinically indicated.  >0.05 ng/mL: POTENTIAL   MYOCARDIAL INJURY. Repeat   testing in 3-6 hrs if   clinically indicated.  NOTE: An increase or decrease   of 30% or more on serial   testing suggests a   clinically important change    16-Nov-15 04:35, CPK-MB, Serum  CPK-MB, Serum 1.5  Result(s) reported on 10 Jan 2014 at 05:07AM.    16-Nov-15 04:35, Troponin I  Troponin I 0.04  0.00-0.05  0.05 ng/mL or less: NEGATIVE   Repeat testing in 3-6 hrs   if clinically indicated.  >0.05 ng/mL: POTENTIAL   MYOCARDIAL INJURY. Repeat   testing in 3-6 hrs if   clinically indicated.  NOTE: An increase or decrease   of 30% or more on serial   testing suggests a   clinically important change    16-Nov-15 08:31, CPK-MB, Serum  CPK-MB, Serum 1.2  Result(s) reported on 10 Jan 2014 at 09:19AM.    16-Nov-15  08:31, Troponin I  Troponin I 0.05  0.00-0.05  0.05 ng/mL or less: NEGATIVE   Repeat testing in 3-6 hrs   if clinically indicated.  >0.05 ng/mL: POTENTIAL   MYOCARDIAL INJURY. Repeat   testing in 3-6 hrs if   clinically indicated.  NOTE: An increase or decrease   of 30% or more on serial   testing suggests a   clinically important change  Routine Hem:    16-Nov-15 00:31, Hemogram, Platelet Count  WBC (CBC) 6.2  RBC (CBC) 2.82  Hemoglobin (CBC) 8.9  Hematocrit (CBC) 27.1  Platelet Count (CBC) 247  Result(s) reported on 10 Jan 2014 at 12:41AM.  MCV 96  MCH 31.4  MCHC 32.7  RDW 14.9    17-Nov-15 03:40, CBC Profile  WBC (CBC) 6.1  RBC (CBC) 2.48  Hemoglobin (CBC) 7.4  Hematocrit (CBC) 23.8  Platelet Count (CBC) 211  MCV 96  MCH 30.0  MCHC 31.2  RDW 14.8  Neutrophil % 66.6  Lymphocyte % 17.6  Monocyte % 12.9  Eosinophil % 2.0  Basophil % 0.9  Neutrophil # 4.1  Lymphocyte # 1.1  Monocyte #  0.8  Eosinophil # 0.1  Basophil # 0.1  Result(s) reported on 11 Jan 2014 at 04:46AM.    18-Nov-15 04:37, CBC Profile  WBC (CBC) 5.6  RBC (CBC) 2.49  Hemoglobin (CBC) 7.7  Hematocrit (CBC) 23.9  Platelet Count (CBC) 215  MCV 96  MCH 30.9  MCHC 32.1  RDW 14.9  Neutrophil % 63.4  Lymphocyte % 19.3  Monocyte % 13.9  Eosinophil % 2.5  Basophil % 0.9  Neutrophil # 3.6  Lymphocyte # 1.1  Monocyte # 0.8  Eosinophil # 0.1  Basophil # 0.0  Result(s) reported on 12 Jan 2014 at 05:17AM.    19-Nov-15 04:52, CBC Profile  WBC (CBC) 6.7  RBC (CBC) 2.49  Hemoglobin (CBC) 7.6  Hematocrit (CBC) 24.0  Platelet Count (CBC) 227  MCV 97  MCH 30.5  MCHC 31.6  RDW 14.8  Neutrophil % 71.2  Lymphocyte % 12.6  Monocyte % 13.5  Eosinophil % 1.9  Basophil % 0.8  Neutrophil # 4.8  Lymphocyte # 0.8  Monocyte # 0.9  Eosinophil # 0.1  Basophil # 0.1  Result(s) reported on 13 Jan 2014 at 05:30AM.   RADIOLOGY:  Radiology Results: XRay:    12-Nov-15 04:55, Chest PortableSingle View   Chest Portable Single View  REASON FOR EXAM:    Epigastric pain; SOB  COMMENTS:       PROCEDURE: DXR - DXR PORTABLE CHEST SINGLE VIEW  - Jan 06 2014  4:55AM     CLINICAL DATA:  Mid abdominal pain beginning this morning. Shortness  of breath.    EXAM:  PORTABLE CHEST - 1 VIEW    COMPARISON:  PA and lateral chest 10/15/2011. Single view of the  chest 11/22/2012.    FINDINGS:  There is cardiomegaly and vascular congestion. The lungs are  hyperexpanded. No pneumothorax identified. Basilar atelectasis is  noted.     IMPRESSION:  Cardiomegaly and pulmonary vascular congestion.    Bilateral pulmonary hyperexpansion suggestive of emphysema.      Electronically Signed    By: Inge Rise M.D.    On: 01/06/2014 05:03       Verified By: Ramond Dial, M.D.,    [phone] 00:47, Chest PA and Lateral  Chest PA and Lateral  REASON FOR EXAM:    dyspnea, chest pain  COMMENTS:       PROCEDURE: DXR - DXR CHEST PA (OR AP) AND LATERAL  - Jan 10 2014 12:47AM     CLINICAL DATA:  Shortness of breath and left-sided chest pain for a  few hrs    EXAM:  CHEST  2 VIEW    COMPARISON:01/06/2014    FINDINGS:  Cardiac shadow remains enlarged. The lungs are hyperinflated. Mild  vascular congestion remains. Bibasilar atelectatic changes are now  seen. Chronic compression deformity in the lower thoracic spine is  noted.     IMPRESSION:  COPD and chronic vascular congestion.    Bibasilar atelectatic changes are now seen.      Electronically Signed    By: Inez Catalina M.D.    On: 01/10/2014 00:56       Verified By: Everlene Farrier, M.D.,  [place]:    12-Nov-15 05:39, US Abdomen Limited Survey  US Abdomen Limited Survey  REASON FOR EXAM:    RUQ/epigastric pain  COMMENTS:   Body Site: Gallbladder, Liver, Common Bile Duct    PROCEDURE: [place]  - US ABDOMEN LIMITED SURVEY  - Jan 06 2014  5:39AM     CLINICAL  DATA:  Right upper quadrant and epigastric pain.    EXAM:  US ABDOMEN  LIMITED - RIGHT UPPER QUADRANT    COMPARISON:  CT abdomen and pelvis 04/25/2012.    FINDINGS:  Gallbladder:  Multiple small, mobile gallstones are identified. There is no  gallbladder wall thickening or pericholecystic fluid. Sonographer  reports negative Murphy's sign.    Common bile duct:    Diameter: 0.3 cm.    Liver:    No focal lesion identified. Within normal limits in parenchymal  echogenicity.     IMPRESSION:  Gallstones without evidence of cholecystitis.  Electronically Signed    By: Inge Rise M.D.    On: 01/06/2014 05:43         Verified By: Ramond Dial, M.D.,    17-Nov-15 17:29, US Kidney Bilateral  US Kidney Bilateral  REASON FOR EXAM:    CKD stage V  COMMENTS:       PROCEDURE: [place]  - US KIDNEY  - Jan 11 2014  5:29PM     CLINICAL DATA:  Chronic kidney disease stage V    EXAM:  RENAL/URINARY TRACT ULTRASOUND COMPLETE    COMPARISON:  None.    FINDINGS:  Right Kidney:  Length: 6.8 cm. Atrophic size with increased cortical echogenicity.  Mild cortical thinning. No gross mass or hydronephrosis. No  shadowing calcification or perinephric fluid.    Left Kidney:    Length: 7.7 cm. Normal cortical thickness. Upper normalcortical  echogenicity. Small cysts in peripelvic region largest 1.9 x 1.9 x  1.6 cm. No additional mass or hydronephrosis. No shadowing  calcification or perinephric fluid..    Bladder:    Contains only minimal urine, inadequately evaluated.  Incidentally noted BILATERAL pleural effusions.     IMPRESSION:  Atrophic kidneys bilaterally with medical renal disease changes.    No evidence of solid mass or hydronephrosis.    Small LEFT renal cysts.    BILATERAL pleural effusions.      Electronically Signed    By: Lavonia Dana M.D.    On: 01/11/2014 19:37     Verified By: Burnetta Sabin, M.D.,  [place]:    12-Nov-15 04:55, Chest Portable Single View  PACS Image    [phone] 05:39, US Abdomen Limited Survey   PACS Image    16-Nov-15 00:47, Chest PA and Lateral  PACS Image    17-Nov-15 17:29, US Kidney Bilateral  PACS Image   ASSESSMENT AND PLAN:  Assessment/Admission Diagnosis CKD now progressed to ESRD and needs a permcath Other medical issues as above   Plan Will place a permcath tomorrow. Will see in office with vein mapping for evaluation for AVF in the future. Discussed with patient who is agreeable to proceed.    level 4   Electronic Signatures: Algernon Huxley (MD)  (Signed [phone] 17:20)  Authored: Chief Complaint and History, PAST MEDICAL/SURGICAL HISTORY, ALLERGIES, HOME MEDICATIONS, Family and Social History, Review of Systems, Physical Exam, LABS, RADIOLOGY, Assessment and Plan   Last Updated: 19-Nov-15 17:20 by Algernon Huxley (MD)

## 2014-06-19 NOTE — Consult Note (Signed)
PATIENT NAME:  [name], [name] MR#:  [id number] DATE OF BIRTH:  [birth year]  DATE OF CONSULTATION:  06/15/2011  REFERRING PHYSICIAN:  Dr. Marcello Fennel and [facility]  CONSULTING PHYSICIAN:  Mickie Kozikowski D. Jaekwon Mcclune, MD  INDICATION: Tachycardia, coronary artery disease, possible non-Q-wave myocardial infarction.   HISTORY OF PRESENT ILLNESS: [name] is an 81 year old female with past history of coronary artery disease status post myocardial infarction, PTCA and stent at [facility], osteoarthritis, kidney stones, neuropathy, depression, and anemia complaining of heart racing, shoulder discomfort, and possible angina with neck discomfort. Also, the patient states that everything started all of a sudden with her heart racing, sensation in neck and shoulder. It was reasonably constant. She denied any shortness of breath. EMS was called. She was found to be in SVT. She was treated with adenosine 4 and 6 followed by 9 and 12. Then the patient converted to sinus tachycardia. There was no rhythm strip by EMS. Symptoms eventually resolved. She had work-up in the ER which showed elevated troponin. She was found to have acute on chronic renal insufficiency and failure. She complained of diarrhea and poor oral intake over the last few days. She was subsequently admitted for further evaluation and care.   REVIEW OF SYSTEMS: No blackout spells. No syncope. No nausea. No vomiting. She has had diarrhea. No fever. No chills. No sweats. No weight loss or weight gain. No hemoptysis, hematemesis. No bright red blood per rectum. She has had tachycardia, chest pain, neck pain, angina, and weakness.   PAST MEDICAL HISTORY:  1. Pernicious anemia.  2. Hyperlipidemia.  3. Hypertension.  4. Osteoarthritis. 5. Kidney stones. 6. Coronary artery disease. 7. Inflammatory neuropathy.  8. Depression.   PAST SURGICAL HISTORY:  1. Breast biopsy. 2. Renal stones. 3. Left hip surgery. 4. Tonsillectomy.   FAMILY HISTORY: Coronary artery  disease, cancer.   ALLERGIES: Penicillin.  MEDICATIONS.  1. Calcium and Vitamin D. 2. HCTZ 25 a day. 3. Osteobil six tablets a day. 4. Paroxetine 20 mg daily. 5. Tylenol Extra-Strength. 6. Vitamin B. 7. Metoprolol extended-release 25 twice a day.  8. Zocor 40. 9. MiraLAX p.r.n.   PHYSICAL EXAMINATION:   VITAL SIGNS: Blood pressure 138/73, pulse 110 and regular, respiratory rate 20, afebrile.   HEENT: Normocephalic, atraumatic. Pupils equal and reactive to light.   NECK: Supple. No JVD, bruits, adenopathy.   LUNGS: Clear to auscultation and percussion. No significant wheeze, rhonchi, or rales.    HEART: Regular rate and rhythm. No significant murmur, gallops, or rub.   ABDOMEN: Benign.   EXTREMITIES: Within normal limits.   NEUROLOGIC: Intact.   SKIN: Normal.   LABORATORY, DIAGNOSTIC, AND RADIOLOGICAL DATA: Glucose 106, BUN 22, creatinine 2.01, sodium 138, potassium 3.4. LFTs are normal. TSH 2.79. White count 10.8, hemoglobin 18, hematocrit 34.   EKG sinus tachycardia, rate of 120, nonspecific ST-T changes.   ASSESSMENT:  1. Possible SVT, palpitations, tachycardia.  2. Positive troponins. 3. Possible non-Q-wave myocardial infarction. 4. Acute on chronic renal insufficiency. 5. Hyperlipidemia. 6. Depression. 7. Pernicious anemia. 8. Coronary disease.   PLAN:  1. Agree with admit.  2. Continue telemetry.  3. Continue to follow. 4. For tachycardia will probably increase her beta-blockers. There is no strips to document SVT even though the patient got adenosine.  5. Will continue current medical therapy.  6. Follow-up all the electrolytes. 7. Again, will increase beta-blockade therapy.  8. Would probably not add an antiarrhythmic for now.  9. Will consider outpatient Holter if she shows no significant  SVT while in the hospital. 10. No anticoagulation for now because of troponin.  11. Consider echocardiogram.  12. Will consider further work-up.  13. Follow-up  renal insufficiency. She may need hydration. Will consider Nephrology input to help with that.  14. Continue statin therapy. 15. Follow-up her anemia. She has had pernicious anemia.       16. Will continue B12 for now.  17. Will base further evaluation on her response.   ____________________________ Bobbie Stackwayne D. Juliann Paresallwood, MD ddc:drc D: 06/16/2011 14:23:52 ET T: 06/16/2011 14:48:10 ET JOB#: [id number]  cc: Jadee Golebiewski D. Juliann Paresallwood, MD, <Dictator> Alwyn PeaWAYNE D Shearon Clonch MD ELECTRONICALLY SIGNED 07/03/2011 22:50

## 2014-06-19 NOTE — H&P (Signed)
PATIENT NAME:  [name], [name] MR#:  [id number] DATE OF BIRTH:  1923/03/26  DATE OF ADMISSION:  06/15/2011  PRIMARY CARE PHYSICIAN: Dr. Marcello FennelHande  REFERRING PHYSICIAN:  Dr. Jens SomWiegand  CHIEF COMPLAINT:  Heart racing.   HISTORY OF PRESENT ILLNESS: [name] is an 81 year old female with significant past medical history of coronary artery disease status post myocardial infarction in 1995, had percutaneous [facility]inal coronary [facility] at that time, history of osteoarthritis, kidney stones, neuropathy, depression, and anemia who was complaining this afternoon of a feeling of her heart racing and neck and shoulder discomfort. The patient reports the pain started all of a sudden. She said  it was mainly a heart racing sensation with some neck and shoulder discomfort sensation. The patient reports the feeling was constant. She denies any shortness of breath accompanying that feeling. [name] husband called EMS where actually they found the patient to be in supraventricular tachycardia. She was given adenosine initially 6 mg, then followed by 9, then followed by 12, where there is report it then converted to sinus tachycardia, but unfortunately there is no rhythm strip at this point from the EMS. At this point the patient reports her symptoms have resolved.  The patient had a work-up done in the ED where she was found to have elevated troponin of 0.1. As well the patient was found to have acute on chronic renal failure, baseline creatinine 1.5 to 1.6, which was at 2 at this point.  The patient reports she has been having diarrhea and poor oral intake, even though her diarrhea had resolved for the last few days, but she had been having diarrhea for at least week prior to that. She denies any nausea or vomiting.   PAST MEDICAL HISTORY:  1. Pernicious anemia.  2. Hyperlipidemia.  3. Hypertension.  4. Osteoarthritis.  5. Kidney stones.  6. Coronary artery disease status post myocardial  infarction in 1995. The patient had [facility].  7. Inflammatory neuropathy.  8. Depression.   PAST SURGICAL HISTORY:  1. Breast biopsy. No cancer.  2. Kidney stones.  3. Left hip surgery in 2002. 4. Tonsillectomy.   FAMILY HISTORY: Mother had history of coronary artery disease, dad and brother had history of cancer.   ALLERGIES: Penicillin.   MEDICATIONS:  1. Calcium with vitamin D by mouth daily.  2. Hydrochlorothiazide 25 mg daily.  3. Osteo Build 6 tablets a day.  4. Paroxetine 20 mg oral daily.  5. Tylenol extra strength 1 tablet at bedtime as needed.  6. Vitamin B12 1000 mcg  per mL  injection every month IM.  7. Metoprolol Extended Release 24 hours, 25 mg b.i.d.  8. Zocor 40 mg daily.  9. MiraLAX powder p.r.n.   REVIEW OF SYSTEMS: CONSTITUTIONAL: The patient denies any fever, weight loss, or weight gain. EYES: Denies any blurry vision, double vision or pain. ENT: Denies any tinnitus, ear pain, or hearing loss. RESPIRATORY: Denies any cough, wheezing, or hemoptysis. CARDIOVASCULAR: Complains of chest discomfort and palpitations. Denies any edema, syncope, or near-syncope. GI: Denies any nausea or vomiting. Had complained of diarrhea, but currently resolved.  GU: Denies any polyuria, polydipsia, or renal colic. ENDOCRINE: Denies any polyuria, polydipsia, or heat or cold intolerance. HEMATOLOGIC: Denies any easy bruising, bleeding diathesis, or blood clots. Has anemia. INTEGUMENT: Denies any acne, rash, or itching. MUSCULOSKELETAL: Denies any back pain or gout. NEURO: Denies any numbness, dysarthria, epilepsy, tremors, or vertigo. PSYCH: Denies any insomnia. Has anxiety  and depression. Denies any alcohol or drug abuse.   PHYSICAL EXAMINATION:  VITAL SIGNS: Temperature 96.7, pulse 110, respiratory rate 20, blood pressure 138/73, saturating 99% on 2 liters nasal cannula.   GENERAL: Elderly, frail female who is comfortable in bed in no  apparent distress.   HEENT: Head atraumatic, normocephalic. Pupils equal, reactive to light. Pink conjunctiva. Anicteric sclerae. Moist oral mucosa.   NECK: Supple. No thyromegaly. No JVD.   CHEST: Good air entry bilaterally. No wheezing, rales, or rhonchi.   CARDIOVASCULAR: S1, S2 heard. No rubs, murmurs, or gallops. Regular rhythm but tachycardic.   ABDOMEN: Soft, nontender, nondistended. Bowel sounds present.   EXTREMITIES: No edema, no clubbing, no cyanosis.   SKIN: No rash. No erythema.   PSYCH: Appropriate affect. Awake, alert, oriented times three. Intact judgment and insight.   NEUROLOGIC: Cranial nerves grossly intact. Motor five out of five in all four extremities.  PERTINENT LABS: Glucose 106, BUN 22, creatinine 2.01, sodium 138, potassium 3.4, chloride 102, CO2 21, total protein 7.8, albumin 3.7, alkaline phosphatase 62, AST 51, ALT 21, troponin 0.1. TSH 2.79, white blood cells 10.8, hemoglobin 11, hematocrit 34.3, platelets 280. EKG showing sinus tachycardia rhythm of 127 with J-point in V2, V3, and V4.   ASSESSMENT AND PLAN:  1. Palpitations, discomfort: This is secondary to supraventricular tachycardia. The patient was given adenosine and currently converted to sinus tachycardia. I will resume patient on metoprolol, give her first dose now. We will order 2-D echo and we will admit her to the telemetry unit.  2. Positive troponins:  Unclear if this is secondary to acute coronary syndrome. This is most likely in response to [name] supraventricular tachycardia as well as due to [name] worsening acute renal failure. We will cycle cardiac enzymes. The patient was given aspirin in the ED and will be continued on aspirin. She is on statin. She is on beta blocker. No ACE or ARB at this point secondary to her acute on chronic renal failure and we will consult cardiology.  3. Acute on chronic renal failure: This is most likely secondary to dehydration as the patient had  diarrhea, poor oral intake. We will continue with IV fluids and we will check BMP in 24 hours.  4. Hyperlipidemia: Continue with statin.  5. Depression: Continue with paroxetine.  6. Pernicious anemia: The patient to continue IM B12 q. month.  7. Deep vein thrombosis prophylaxis: Subcutaneous heparin every 12 hours.  8. CODE STATUS: The patient is FULL CODE.   TOTAL TIME SPENT ON PATIENT CARE: 60 minutes.   ____________________________ Starleen Arms, MD dse:bjt D: 06/15/2011 04:46:29 ET T: 06/15/2011 09:21:25 ET JOB#: [id number]  cc: Starleen Arms, MD, <Dictator> Barbette Reichmann, MD Howell Groesbeck Teena Irani MD ELECTRONICALLY SIGNED 06/16/2011 1:12

## 2014-06-19 NOTE — Discharge Summary (Signed)
PATIENT NAME:  [name], [name] MR#:  [id number] DATE OF BIRTH:  1923/11/02  DATE OF ADMISSION:  06/15/2011 DATE OF DISCHARGE:  06/17/2011  DISCHARGE DIAGNOSES:  1. Supraventricular tachycardia.  2. Positive troponin, most likely secondary to demand ischemia.  3. Hyperlipidemia.  4. Depression.  5. Anemia.   CHIEF COMPLAINT: Palpitations.   HISTORY OF PRESENT ILLNESS: Suzanne CoolerKathryn [name] is an 81 year old female with a history of coronary artery disease status post previous myocardial infarction in 1995 and percutaneous transl[facility]inal coronary angioplasty at [facility] at that time, history of osteoarthritis, kidney stone, neuropathy, depression, and anemia who presented to the ER complaining of palpitations associated with discomfort in the neck and shoulder, more on the left side. The patient said her symptoms came on suddenly and when her husband called EMS she was noted to be in supraventricular tachycardia and was given adenosine 6 mg follow-up by 9 mg and subsequently 12 mg and later converted to sinus rhythm. Unfortunately no rhythm strip was obtained from EMS. The patient did have an elevated troponin of 0.1 and his serum creatinine had gone up to 2 from a baseline of 1.5 to 1.6.   PAST MEDICAL HISTORY:  1. Pernicious anemia. 2. Hyperlipidemia. 3. Hypertension. 4. Osteoarthritis. 5. Kidney stone. 6. Coronary artery disease status post myocardial infarction in 1995. 7. Perc[facility]. 8. History of inflammatory arthropathy. 9. Depression.  10. Previous breast biopsy.  11. Kidney stone. 12. Left hip surgery.  13. Tonsillectomy.   PHYSICAL EXAMINATION: She was afebrile, pulse was 110, respirations 20, blood pressure 138/73, and oxygen saturation 99% on 2 liters nasal cannula. She was not in distress. HEENT was normocephalic, atraumatic. Anicteric sclerae. Heart had S1 and S2. No rubs or murmurs. Lungs were clear to auscultation.  Abdomen was soft and nontender. Extremities had no edema.   LABS/STUDIES: Glucose 106, BUN 22, creatinine 2.01, sodium 138, potassium 3.4, chloride 102, CO2 21, total protein 7.8, albumin 3.7, alkaline phosphatase 62, AST 51, and ALT 21. Troponin 0.1. TSH 2.79. WBC count 10.8, hemoglobin 11, hematocrit 34.3, and platelets 283.   EKG showed sinus tachycardia.   HOSPITAL COURSE: The patient was seen in consultation by Dr. Juliann Paresallwood, cardiologist, who recommended increasing her beta blocker therapy, continue statin therapy, consider echocardiogram, and consider Holter monitor as an outpatient. It was felt that the patient should be managed conservatively. During her stay in the hospital, the patient remained in sinus rhythm. She was ambulated and overall remained asymptomatic. She was discharged home in stable condition on the following medications.   DISCHARGE MEDICATIONS:  1. Aspirin 81 mg a day.  2. Simvastatin 40 mg a day.  3. Paroxetine 20 mg a day.  4. Metoprolol 25 mg p.o. twice a day.  DISCHARGE INSTRUCTIONS/FOLLOWUP: She was advised to followup with me, Dr. Barbette ReichmannVishwanath Flower Franko, in 1 to 2 weeks time. Her renal function did improve with hydration with serum creatinine coming down to 1.46. ____________________________ Barbette ReichmannVishwanath Draycen Leichter, MD vh:slb D: 06/17/2011 13:04:29 ET    T: 06/17/2011 13:39:57 ET        JOB#: [id number] cc: Barbette ReichmannVishwanath Odilon Cass, MD, <Dictator> Barbette ReichmannVISHWANATH Von Inscoe MD ELECTRONICALLY SIGNED 07/11/2011 13:17

## 2014-06-22 NOTE — Discharge Summary (Signed)
PATIENT NAME:  [name], [name] MR#:  [id number] DATE OF BIRTH:  1923-05-13  DATE OF ADMISSION:  02/14/2014 DATE OF DISCHARGE:  02/15/2014   ADMITTING PHYSICIAN: Enid Baas, MD  DISCHARGING PHYSICIAN: Enid Baas, MD    PRIMARY CARE PHYSICIAN:  Horton Chin, MD   CONSULTATIONS IN THE HOSPITAL: Nephrology consultation by Mady Haagensen, MD   DISCHARGE DIAGNOSES:  1.  Acute respiratory distress requiring BiPAP on admission.  2.  Acute pulmonary edema.  3.  Acute on chronic systolic congestive heart failure exacerbation.  4.  End-stage renal disease Monday, Wednesday, Friday hemodialysis.  5.  Ischemic cardiomyopathy, status post automatic implantable cardiac defibrillator.  6.  Dementia.  7.  Anemia of chronic disease.  8.  History of transient ischemic attack.  9.  Hypertension.  10. Hyperlipidemia.   DISCHARGE HOME MEDICATIONS:  1.  Plavix 75 mg p.o. daily.  2.  Amiodarone 200 mg p.o. daily.  3.  Lisinopril 5 mg p.o. daily.  4.  Coreg 3.125 mg p.o. b.i.d. 5.  Percocet 5/325 mg 1 tablet every 4 hours p.r.n. for pain.  6.  Meloxicam 7.5 mg p.o. daily.  7.  Aricept 5 mg p.o. daily at bedtime.  8.  Ferrous gluconate 324 mg 2 tablets once a day.  9.  Klonopin 0.5 mg twice a day as needed for anxiety.  10. Hydroxyzine 10 mg per 5 mL, 10 mL 4 times a day as needed for itching.   DISCHARGE DIET: Low-sodium and renal diet.   DISCHARGE ACTIVITY: As tolerated.    FOLLOWUP INSTRUCTIONS:  1.  Follow up for dialysis 02/16/2014.  2.  Strict sodium and renal diet.  3.  Follow up with PCP in 1 to 2 weeks.   LABORATORIES AND IMAGING STUDIES PRIOR TO DISCHARGE: WBC 6.6, hemoglobin 9.0, hematocrit 28.8, platelet count is 188.  Sodium 141, potassium 3.6, chloride 102, bicarbonate 32, BUN 12, creatinine 1.8 and glucose 87, and calcium of 8.4.  Chest x-ray showing mild infiltrate in base, atelectasis versus infection. left greater than right noted.   BRIEF HOSPITAL COURSE:  [name] is an 81 year old Caucasian female with a history of dementia, hypertension, end stage renal disease, started on hemodialysis last month on 01/14/2013, Monday, Wednesday Friday schedule, who presents to the hospital secondary to acute onset of respiratory distress.  1.  Acute hypoxic respiratory failure requiring BiPAP secondary to acute pulmonary edema from acute on chronic congestive heart failure exacerbation, requiring urgent hemodialysis.  The patient was due for dialysis anyway on the day of admission, however, presenting with hypoxic respiratory failure. She was weaned to nasal cannula after the dialysis and currently on room air.  She is being discharged home.  She probably might have had a dietary noncomplaince resulting in this episode and strictly advised about low sodium and renal diet.   Small infiltrate noted on CXR- likely atelectasis, as pt not sympotmatic. will not start on unnecessary antibiotics.  All her home medications are being continued without any changes.   DISCHARGE CONDITION: Stable.   DISCHARGE DISPOSITION: Home.   TIME SPENT ON DISCHARGE:  35 minutes.   Physical therapy recommended home health, however, the patient husband did mention that the patient was just discharged from physical therapy home services with home health recently, so he refused further home health at this time.      ____________________________ Enid Baas, MD rk:DT D: 02/15/2014 16:13:38 ET T: 02/15/2014 16:40:36 ET JOB#: [id number]  cc: Enid Baas, MD, <Dictator> Mosetta Pigeon, MD Shamil J.  Patrecia PaceMorayati, MD  Enid BaasADHIKA Wesleigh Markovic MD ELECTRONICALLY SIGNED 03/01/2014 18:22

## 2014-06-22 NOTE — H&P (Signed)
PATIENT NAME:  [name], [name] MR#:  [id number] DATE OF BIRTH:  07/22/1923  DATE OF ADMISSION:  02/14/2014  ADMITTING PHYSICIAN:  Dr. Enid Baas.    PRIMARY CARE PHYSICIAN: Dr. Patrecia Pace.    PRIMARY NEPHROLOGIST: Dr. Thedore Mins.    REASON FOR ADMISSION: Difficulty breathing.   HISTORY OF PRESENT ILLNESS: Miss Siravo is an 80 year old elderly Caucasian female with a past medical history significant for congestive heart failure, ischemic cardiomyopathy, EF of 20% status post AICD, coronary artery disease status post stents, CVA, hypertension, endstage renal disease who was recently started on hemodialysis during her last admission in November 2015, anemia of chronic disease, who presents to the hospital from home secondary to difficulty breathing. The patient is a very poor historian, she does not remember when she came to the hospital. She feels like she was having stroke at home and she could not breathe and was brought to the hospital. She was hypoxic and tachypneic when she came in and was placed on BiPAP immediately. Right now she is on BiPAP with improvement in her symptoms. Chest x-ray showed pulmonary edema. She is actually due for dialysis today and her last dialysis was on Friday as per schedule. She is being admitted for respiratory distress.   PAST MEDICAL HISTORY:  1. Chronic systolic congestive heart failure, EF of 25%.  2. Ischemic cardiomyopathy status post AICD.   3. Coronary artery disease status post stents.  4. Endstage renal disease Monday, Wednesday, Friday hemodialysis.  5. Dementia.  6. Anemia of chronic disease.  7. History of TIA.  8. Hypertension.  9. Hyperlipidemia.  10. Cholelithiasis.   PAST SURGICAL HISTORY:  Right IJ tunneled PermCath placement   ALLERGIES TO MEDICATIONS: PENICILLIN.   CURRENT HOME MEDICATIONS:  1. Amiodarone 200 mg p.o. daily.  2. Coreg 3.125 mg p.o. b.i.d.  3. Klonopin 0.5 mg p.o. b.i.d. p.r.n. for anxiety.  4. Plavix 75 mg p.o.  daily.  5. Donepezil 5 mg p.o. at bedtime.  6. Ferrous gluconate 324 mg 2 tablets daily.  7. Hydroxyzine 10 mg per 5 mL syrup 10 mL orally 4 times a day as needed for itching.  8. Lisinopril 5 mg p.o. daily.  9. Meloxicam 7.5 mg p.o. daily.  10. Percocet 5/325 mg q. 4 hours p.r.n. for pain.   SOCIAL HISTORY: Lives at home with her husband. Has some braces for her legs, but ambulates with the help of a cane. No smoking or alcohol use.   FAMILY HISTORY: Significant for heart disease in the family.   REVIEW OF SYSTEMS:   CONSTITUTIONAL: No fever, fatigue, or weakness.  EYES: Positive for blurred vision. No glaucoma, inflammation, or cataracts.  EARS, NOSE, AND THROAT: No tinnitus, ear pain, hearing loss, epistaxis, or discharge.  RESPIRATORY: Positive for dyspnea on exertion. No hemoptysis. No COPD.   CARDIOVASCULAR: No chest pain, orthopnea, edema, arrhythmia, palpitations, or syncope.  GASTROINTESTINAL: No nausea, vomiting, diarrhea, abdominal pain, hematemesis, or melena.  GENITOURINARY: No dysuria, hematuria. The patient is oliguric from her endstage renal disease.   ENDOCRINE: No polyuria, nocturia, thyroid problems, heat or cold intolerance.  HEMATOLOGY: No anemia, easy bruising, or bleeding.  SKIN: No acne, rash, or lesions.  MUSCULOSKELETAL: No neck, back, shoulder pain, arthritis, or gout.  NEUROLOGICAL: No numbness, weakness. History of TIA. No seizures.  No ataxia.  PSYCHOLOGICAL: No anxiety, insomnia, or depression.   PHYSICAL EXAMINATION:  VITAL SIGNS: Temperature 98.4 degrees Fahrenheit, pulse 80, respirations 18, blood pressure 162/77, pulse oximetry 99% on 2 liters.  GENERAL:  Thin built, well-nourished female lying in bed, not in any acute distress.  HEENT: Normocephalic, atraumatic. Pupils equal, round, reacting to light. Anicteric sclerae. Extraocular movements intact. Oropharynx clear without erythema, mass, or exudates.  NECK: Supple.  No thyromegaly, JVD, or carotid  bruits.  No lymphadenopathy.   LUNGS: Moving air bilaterally. Coarse rales at the bases. No rhonchi. No use of accessory muscles for breathing.  CARDIOVASCULAR: S1, S2, regular rate and rhythm, 2/6 systolic murmur heard. No rubs or gallops.  ABDOMEN: Soft, nontender, nondistended. No hepatosplenomegaly. Normal bowel sounds.  EXTREMITIES: No pedal edema. No clubbing or cyanosis. 2 + dorsalis pedis pulses palpable bilaterally.  SKIN: No acne, rash, or lesions.  LYMPHATICS: No cervical lymphadenopathy.  NEUROLOGIC: Cranial nerves intact. No focal motor or sensory deficits.  PSYCHOLOGICAL: The patient is awake, alert, oriented x 1-2.   LABORATORY DATA:  WBC 6.3, hemoglobin 10.7, hematocrit 34.9, platelet count 319,000.   Sodium 142, potassium 3.8, chloride 105, bicarbonate 31, BUN 32, creatinine 3.97, glucose 144,  calcium of 9.2. BNP is 51,430. Troponin is 0.04.   Chest x-ray showing pulmonary vascular congestion, COPD changes, left upper lobe atelectasis versus pneumonia. Followup chest x-ray recommended. Improved aeration of right lung base and left pleural effusion noted.   EKG showing normal sinus rhythm, nonspecific intraventricular conduction block, no acute ST-T wave changes.   ASSESSMENT AND PLAN: An 81 year old female with history of coronary artery disease, congestive heart failure, endstage renal disease on hemodialysis, hypertension, history of transient ischemic attack, presents from home secondary to worsening breathing.   1.  Acute respiratory distress secondary to pulmonary edema, did not miss dialysis, could be secondary to acute on chronic systolic congestive heart failure exacerbation, known ejection fraction of 20%-25%. Admit. She is on BiPAP for now, wean to nasal cannula. Nephrology has been notified, she will have dialysis today.  2.  Congestive heart failure, ischemic cardiomyopathy, low ejection fraction. Continue cardiac medications, for dialysis p.r.n.  3.  Endstage  renal disease on hemodialysis Monday, Wednesday, Friday schedule, for dialysis per schedule today. Nephrology has been consulted.  4.  Coronary artery disease, appears stable, no symptoms. Continue cardiac medications. 5.  Dementia, appears to be at baseline. Continue her Aricept.  6.  Deep vein thrombosis prophylaxis with subcutaneous heparin.  7.  Code status, full code.   TIME SPENT ON ADMISSION: 50 minutes.    ____________________________ Enid Baasadhika Dorna Mallet, MD rk:bu D: 02/14/2014 18:00:29 ET T: 02/14/2014 19:04:27 ET JOB#: [id number]  cc: Enid Baasadhika Roslyn Else, MD, <Dictator> Mosetta PigeonHarmeet Singh, MD Enid BaasADHIKA Sriyan Cutting MD ELECTRONICALLY SIGNED 03/01/2014 18:29

## 2014-06-26 NOTE — Discharge Summary (Signed)
PATIENT NAME:  [name], [name] MR#:  [id number] DATE OF BIRTH:  May 18, 1923  DATE OF ADMISSION:  04/17/2014 DATE OF DISCHARGE:  04/20/2014  DISCHARGE DIAGNOSES: 1.  Acute on chronic diastolic heart failure.  2.  End-stage renal disease on hemodialysis.  3.  Chest pain.  4.  Dementia.  DISCHARGE MEDICATIONS: 1.  Clopidogrel 75 mg oral once a day.  2.  Amiodarone 400 mg oral once a day.  3.  Lisinopril 5 mg oral once a day.  4.  Carvedilol 3.125 mg oral 2 times a day.  5.  Meloxicam 7.5 mg oral once a day.  6.  Ferrous glucose 324 mg oral once a day.  7.  Clonazepam 0.5 mg oral 2 times a day as needed for anxiety.  8.  Hydroxyzine 10 mL oral 4 times a day as needed for itching.  9.  Acetaminophen/hydrocodone 325/5 mg oral every 4 hours as needed for pain.  10.  Cyclobenzaprine 5 mg oral tablet 2 times a day as needed for spasms.  11.  Spironolactone 25 mg oral tablet once a day.  12.  Donepezil 10 mg oral tablet once a day.  HOME HEALTH ON DISCHARGE: Yes. Home health services, physical therapy, and nurse aide. Advised to have low-sodium, low-fat, low-cholesterol diet and to follow up in 1 to 2 weeks with primary care physician, Dr. Corliss ParishJohn Min, [facility].   HISTORY OF PRESENT ILLNESS: An 81 year old female with end-stage renal disease on hemodialysis every Monday, Wednesday, and Friday. Also has a history of coronary artery disease, status post MI, has chronic CHF, but is not on oxygen at home. Presents to the Emergency Room in the morning with worsening shortness of breath as well as chest pain radiating through to the back. In the Emergency Room, the patient was noted to be in congestive heart failure. She was also noted to have an elevated troponin. She was hypoxic, requiring oxygen and is now admitted for further evaluation.  HOSPITAL COURSE AND STAY:  1.  Acute and chronic combined congestive heart failure. The patient was given Lasix. She was on  hemodialysis, taken for dialysis and had good diuresis. After that, she continued to feel better. Cardiology saw the patient. They had not suggested any other workup, as the patient was stable.  2.  Elevated troponin. The patient remained stable. Most likely it was due to stress of congestive heart failure. Cardiology was on the case. No further workup was done. The patient monitored on telemetry.  3.  Chest pain. Due to shortness of breath, it resolved with diuresis.  4.  Atherosclerotic cardiovascular disease. Continue carvedilol, lisinopril, Plavix, and amiodarone.  5.  End-stage renal disease, on hemodialysis. Nephrology was on the case and continued her routine dialysis in hospital. 6.  Confusion. The patient had severe dementia. We called psychiatric consult. They started the patient on Aricept. I spoke to her husband. As per him, she walks with a walker and completely demented. So husband takes care of her medication and other issues. On discharge, we arranged for nurse for her.  IMPORTANT LABORATORY RESULTS: 1.  Chest x-ray, PA and lateral on admission showed cardiomegaly with developing mild interstitial edema pattern.  2.  WBC count was 10.3, hemoglobin 10.6, platelet count 310,000, MCV was 93 on admission. Troponin was 0.17. 3.  Creatinine was 3.70, sodium 140, potassium 5.0, CO2 of 33, calcium 8.9.  4.  Troponin went up to 0.2 and then 0.21. 5.  Echocardiogram shows  ejection fraction, severe last year decreased 20% to 25%, severely decreased  systolic function and mild dilated right atrium, moderate pleural effusion in left . 6.  On further followup, creatine remained stable  7.  Chest x-ray, portable, single view, on next day showed suspected worsening asymmetric pulmonary edema, right greater than left.   total time spent on this discharge is 40 minutes.   ____________________________ [name] Elisabeth Pigeon, MD vgv:sw D: 04/24/2014 20:44:52 ET T: 04/25/2014 08:21:23  ET JOB#: [id number]  cc: [name]. Elisabeth Pigeon, MD, <Dictator> Corliss Parish, MD Lamar Blinks, MD  Altamese Dilling MD ELECTRONICALLY SIGNED 05/09/2014 12:55

## 2014-06-26 NOTE — Discharge Summary (Signed)
PATIENT NAME:  [name], [name] MR#:  [id number] DATE OF BIRTH:  [birth year]  DATE OF ADMISSION:  03/16/2014 DATE OF DISCHARGE:  03/17/2014  DISCHARGE DIAGNOSES: 1. T12 vertebral compression fracture.  2. Left lung nodule.  3. End-stage renal disease.  4. Hypertension.  5. Dementia.  6. Paroxysmal atrial fibrillation.   CONSULTANTS:  1. Dr. Thedore MinsSingh with nephrology.  2. Dr. Joice LoftsPoggi with orthopedics.  3. Dr. Doylene Canninghoksi with oncology.   IMAGING STUDIES: CT scan of the abdomen and pelvis without contrast with stone protocol showed no hydronephrosis or ureteral calculus. Cholelithiasis without evidence of acute cholecystitis. A 17 mm nodule in the left lower lobe, which has enlarged from 2014, suspicious for bronchogenic carcinoma. Chest x-ray showed new T6 compression deformity when compared with 02/13/2014.   ADMISSION HISTORY AND PHYSICAL: Please see detailed H and P dictated by Dr. Betti Cruzeddy. In brief, an 81 year old female patient with ischemic cardiomyopathy, CAD, end-stage renal disease, presented to the hospital complaining of some right-sided back pain. The patient was found to have a T6 compression fracture on chest x-ray and admitted for pain control.   HOSPITAL COURSE: 1. Right-sided upper back pain. The patient was initially started on IV and oral pain medications, which has been transitioned to oral pain medications, with which her pain is much improved. She ambulated with PT and recommended home health services, which have been set up. The patient does have a compression fracture of T6 and also old T12. Was seen by orthopedics. Initially, plan was to an MRI, but orthopedic surgery, Dr. Joice LoftsPoggi, did not feel like she needs any emergent vertebroplasty and an MRI was deferred.  2. Left lower lobe pulmonary nodule. The patient does have a smoking history, is high risk for bronchogenic carcinoma. She will follow up with [facility] regarding the pulmonary nodule.  3. End-stage renal  disease. The patient was seen by nephrology, had dialysis during the hospital stay.   DISCHARGE CONDITION: Prior to discharge, the patient has mild tenderness in the upper back area, but lungs sound clear. S1, S2 heard. Abdomen nontender.   DISCHARGE MEDICATIONS: Please refer to medication reconciliation.   DISCHARGE INSTRUCTIONS: Renal diet. Activity as tolerated with a walker. Work with physical therapy, home health, which has been set up. Follow up with Dr. Doylene Canninghoksi, primary care physician, and Dr. Rosita KeaMenz in 1-2 weeks.    ____________________________ Molinda BailiffSrikar R. Jarrah Babich, MD srs:mw D: 03/17/2014 14:44:03 ET T: 03/17/2014 15:25:31 ET JOB#: [id number]  cc: Wardell HeathSrikar R. Kenlea Woodell, MD, <Dictator> Janak K. Doylene Canninghoksi, MD Leitha SchullerMichael J. Menz, MD Alan MulderShamil J. Morayati, MD  Orie FishermanSRIKAR R Kiefer Opheim MD ELECTRONICALLY SIGNED 03/23/2014 16:55

## 2014-06-26 NOTE — Consult Note (Signed)
Brief Consult Note: Diagnosis: T12 compression fracture.   Patient was seen by consultant.   Consult note dictated.   Orders entered.   Discussed with Attending MD.   Comments: Pt. states that she doesn't have too much pain at this time.  Given her age and medical issues, I don't think an urgent vertebroplasty is warranted at this time.  I suggested a L-S corset, but she was ambivalent about wearing it, and worried about whether she could put it on by herself.  As the fracture is stable, she can be managed with pain meds and activity modification, then f/u with Dr. Rosita KeaMenz in 2-3 weeks.  Thanks!.  Electronic Signatures: Ninfa LindenPoggi, Jeff (MD)  (Signed 20-Jan-16 17:07)  Authored: Brief Consult Note   Last Updated: 20-Jan-16 17:07 by Ninfa LindenPoggi, Jeff (MD)

## 2014-06-26 NOTE — H&P (Signed)
PATIENT NAME:  [name], [name] MR#:  [id number] DATE OF BIRTH:  [birth year]  DATE OF ADMISSION:  05/03/2014  PRIMARY CARE PHYSICIAN: Alan Mulder, MD  EMERGENCY ROOM PHYSICIAN: Dr. Derrill Kay    CHIEF COMPLAINT: Generalized weakness and hypoxia.   HISTORY OF PRESENT ILLNESS: The patient is an 81 year old female patient with past medical history of ESRD on hemodialysis Monday, Wednesday, Friday, history of CAD with angioplasty in 1994, recent admission on February 21 through 24 for CHF exacerbation, comes in because of generalized weakness and shortness of breath. The patient found to have O2 saturation of 70% by home care nurse. EMS was called and the patient noticed to have shortness of breath and generalized weakness. According to the husband, she was very weak this morning, unable to get up from the bed and had some shortness of breath. The patient noted to have an elevated troponin of 4.25 and we are admitting for non-ST-elevation MI. The patient was seen in the Emergency Room 2 days ago on March 6 for trouble breathing and troponins that day were at 0.1. The patient has been here multiple times, admitted 5 times in the last 6 months and very poor general health condition.  Lives with her husband. The patient had trouble breathing, but here in the ER she noticed that she had chest discomfort on the left side, comes and goes, associated with radiation of pain to the left arm. Denies nausea, vomiting or sweating. The patient had no syncope.  PAST MEDICAL HISTORY:  1.  Includes ESRD, on hemodialysis Monday, Wednesday, Friday.  2.  Chronic systolic and diastolic heart failure.  3.  History of lung nodule.  4.  History of TIAs.  5.  History of hypertension.  6.  Hyperlipidemia.  7.  Nephrolithiasis.  8.  Cholelithiasis.   FAMILY HISTORY: Significant for hypertension, CAD, and stroke.   ALLERGIES: PENICILLIN.   SOCIAL HISTORY: Lives with the husband. No smoking, no drinking.   HOME  MEDICATIONS: Amiodarone 200 mg daily, lisinopril 5 mg daily, Coreg 3.125 mg p.o. b.i.d., Mobic 7.5 mg daily, ferrous gluconate 324 mg daily, Klonopin 0.5 mg p.o. b.i.d. as needed for anxiety, acetaminophen with hydrocodone 5/325 one tablet every 4 hours as needed, Flexeril 5 mg p.o. b.i.d. as needed, Aldactone 25 mg p.o. daily, Aricept 10 mg p.o. daily.   The patient's Plavix has been stopped 3 weeks ago because of rectal bleed. The patient's family says that the husband says she was found with her panties full of blood, so he took her to the primary doctor, Dr. Patrecia Pace. He stopped aspirin and Plavix 3 weeks ago, and the patient did not have to have a transfusion, did not have any EGD or colonoscopy. The patient's GI bleed resolved after stopping aspirin and Plavix. In the ER, the ER doctor tried to start the heparin drip, but the family refused. Right now hemoglobin is stable at 10.7.   PAST SURGICAL HISTORY: History of angioplasty in 1994.   REVIEW OF SYSTEMS:  CONSTITUTIONAL: No fever. No headache.  EYES: No blurred vision. No inflammation.  ENT: Hard of hearing. No tinnitus. No epistaxis. No difficulty swallowing.  RESPIRATIONS: No cough. Has trouble breathing today. Finished pleural dialysis yesterday.  CARDIOVASCULAR: Chest pain on the left side, comes on and off. Has radiation to the left arm. The patient does have slight pedal edema. No palpitations.  GASTROINTESTINAL: No nausea. No vomiting. No abdominal pain.  GENITOURINARY: No dysuria or hematuria. She is on dialysis.  ENDOCRINE: No  polyphagia or nocturia. No increased sweating.  HEMATOLOGIC: No anemia or easy bruising.  INTEGUMENTARY: No skin rash.  MUSCULOSKELETAL: No joint pains. No gout. Limited activity because of multiple problems, uses cane sometimes.  NEUROLOGIC: The patient has no dysarthria or numbness. The patient does have dementia.  PSYCHIATRIC: No anxiety or insomnia. No schizophrenia. No bipolar disorder.   PHYSICAL  EXAMINATION: VITAL SIGNS: Temperature 98.5, heart rate 78, blood pressure is 170/95, O2 saturations are 98% on 1 liter now and her O2 saturations were 90% on room air with exertion in the emergency room. Documented 70% saturation on nonrebreather by EMS.   GENERAL: She is alert, awake, oriented elderly female, chronically ill-looking but no distress.  HEENT: Normocephalic, atraumatic. Pupils equally reacting to light. Extraocular movements are intact. Sclera nonicteric. Conjunctivae are clear. ENT: No tympanic membrane congestion. No turbinate hypertrophy. No oropharyngeal erythema.  NECK: Supple. No JVD. No lymphadenopathy. No thyroid enlargement.  LUNGS: Bilateral basilar crepitations present. No wheeze. No using accessory muscles for respiration.  CARDIOVASCULAR: S1, S2 regular. No murmurs. The chest wall is nontender. Good pedal pulse, femoral pulse, mild extremity edema.  ABDOMEN: Soft, nontender, nondistended. Bowel sounds present. No hepatosplenomegaly. No hernias.   MUSCULOSKELETAL: Able to move extremities x 4. No cyanosis.  SKIN: No skin rashes. No ulcers.  LYMPHATIC: No lymphadenopathy in cervical or axillary regions.  NEUROLOGIC: Cranial nerves II through XII intact, DTRs 2+ bilaterally, sensations were intact, no dysarthria or aphagia.  PSYCHOLOGICAL: Oriented for time, place, person.   LABORATORY DATA: Troponins 4.25. UA is clear, no leukocyte esterase, bacteria none. The patient's WBC 7.8, hemoglobin 10.7, hematocrit 34.3, platelets 264,000. Electrolytes: Sodium is 143, potassium 3.9, chloride 98, bicarbonate 34, BUN 17, creatinine 1.69, glucose 124, troponins 4.25. EKG: Normal sinus rhythm with LBBB 77 beats per minute.   ASSESSMENT AND PLAN:  1.  This is an 81 year old female with shortness of breath, non-ST-elevation myocardial infarction admitted to telemetry. Continue her beta blockers, start Crestor. The patient will  be on nitro paste, will restart the heparin drip. I have  discussed extensively about anticoagulation and discussed that it can be stopped with short half-life and if she gets a GI bleed, we will check the CBC closely, stop the heparin drip, and get a GI consult. The patient and her husband agreed for a heparin drip. We will check stool guaiac, start a heparin drip, and cardiology consult with Dr. Lady Gary.  2.  Regarding non-ST-elevation myocardial infarction, the patient will be on beta blockers, statins, ACE inhibitors, and heparin drip. We will not give her dual-anticoagulation therapy because of the concern for GI bleed, so hold aspirin at this time. Continue heparin drip.  3.  End-stage renal disease, on hemodialysis. Consult nephrology for  HD 4.  Shortness of breath. Oxygen saturations nicely improved with minimal oxygen. Will continue oxygen support. No further workup for hypoxia is planned.  5. Chronic systolic heart failure. Ejection fraction of 20% to 25% by echocardiogram done last month. The patient has chronic systolic heart failure. Her code status is do not resuscitate. Continue ACE, ARBS, and beta blockers.  6.  Alzheimer dementia.  7.  Because of multiple medical problems and multiple recurrent admissions, we will consult palliative care tomorrow morning. I discussed the plan with the patient's husband.  TIME SPENT: I spent more than 55 minutes.    ____________________________ Katha Hamming, MD sk:at D: 05/03/2014 15:46:35 ET T: 05/03/2014 17:50:40 ET JOB#: [id number]  cc: Katha Hamming, MD, <Dictator> Katha Hamming MD ELECTRONICALLY  SIGNED 05/03/2014 18:33

## 2014-06-26 NOTE — Consult Note (Signed)
   Comments   T/C to patient's spouse, Hilaria OtaDon Zuver.  He states that he has spoken to multiple family members.  He decided he would like to stop the patient's dialysis and enroll her in hospice.  He is agreeabble to d/c her to the hospice home, likely tomorrow.  Nephrology, pt's attending and hospice liason notified.hospice diagnosis ESRD  Electronic Signatures: Reginal Lutesowe, Caelen Higinbotham L (NP)  (Signed 10-Mar-16 15:51)  Authored: Palliative Care   Last Updated: 10-Mar-16 15:51 by Reginal Lutesowe, Jaidy Cottam L (NP)

## 2014-06-26 NOTE — Consult Note (Signed)
Brief Consult Note: Diagnosis: Very poor historian. States she doesnt know why she is here. Does not know she gets hd. Was recently admitted with cp and sob. Apparently here with cp and sob. Ruled in for nstemi.   Patient was seen by consultant.   Recommend further assessment or treatment.   Orders entered.   Comments: 81 yo female with esrd on hd admitted with apparent cp. Has serum troponin of 4.25. Has know ef of 20% by echo several weeks ago. Hemodynaically stable Denies cp at present. Apparently compliant with hd but denied being on hd when asked. EKG shows no ischemia. CXR shows mild pulmonaria vascular engogrgement WIll continue with heparin for now and follow cardiac markers and exam. Not an ideal candidate Had elevated troponin on previous admission to 0.1 but up to 4.25 today. Was on asa and plavix but concern about gi bleeding. WIll attempt to medically manaage. Full note to follow..  Electronic Signatures: Dalia HeadingFath, Edda Orea A (MD)  (Signed 08-Mar-16 17:14)  Authored: Brief Consult Note   Last Updated: 08-Mar-16 17:14 by Dalia HeadingFath, Kouper Spinella A (MD)

## 2014-06-26 NOTE — Consult Note (Signed)
Chief Complaint:  Subjective/Chief Complaint Mid-back pain unchanged versus yeasterday   Brief Assessment:  GEN well developed, well nourished, no acute distress   Additional Physical Exam Back - Mild tenderness over midback region, NV unchanged to both LE's   Assessment/Plan:  Assessment/Plan:  Assessment T6 compression fx   Plan OOB to chair with PT in LS corset Continue pain meds prn F/u with Dr. Rosita KeaMenz in 2-3 weeks   Electronic Signatures: Ninfa LindenPoggi, Jeff (MD)  (Signed 21-Jan-16 12:26)  Authored: Chief Complaint, Brief Assessment, Assessment/Plan   Last Updated: 21-Jan-16 12:26 by Ninfa LindenPoggi, Jeff (MD)

## 2014-06-26 NOTE — Consult Note (Signed)
PATIENT NAME:  [name], [name] MR#:  [id number] DATE OF BIRTH:  September 19, 1923  DATE OF CONSULTATION:  04/19/2014  REFERRING PHYSICIAN:   CONSULTING PHYSICIAN:  Audery Amel, MD  IDENTIFYING INFORMATION AND REASON FOR CONSULTATION: An 81 year old woman with endstage renal failure on hemodialysis, congestive heart failure, who is currently in the hospital for worsening of her condition. Consult was for strange behavior.   HISTORY OF PRESENT ILLNESS: Information obtained from the patient and the chart. I consulted on this patient back in November as well. The patient at that time had clear dementia, although she seemed to be able to function adequately given that her husband seemed to still be able to take care of her. At that time, she was still able to have a reasonably lucid conversation. On evaluation today, I found the patient to be neat and awake and alert and perfectly happy to engage in conversation. She had no chief complaint. Denied being in any pain or discomfort at all. The patient had no idea that she was in the hospital and once I did tell her she was in the hospital had no idea that she was a patient here. Could not tell me anything at all about any of her medical problems. Had no idea what kind of treatment she was getting. Really was not able to give any interval history psychiatrically at all, except that she had no complaints.   PAST PSYCHIATRIC HISTORY: Identified as having dementia, probably largely Alzheimer disease related. Previously, when I saw her in November, at that time I had started her on a low dose of Aricept and had suggested that the dose be increased after discharge. She also was not taking benzodiazepines at that point. I reviewed the several admissions that she has had since then and it looks like the Aricept has somehow been discontinued and the clonazepam not only reintroduced but doubled in dosage. I could not find any definite reason for that. The patient has a past  history of some kind of mental health problem decades ago, which neither she nor her husband could remember the details of. No history of suicidality. No history of psychosis or bipolar disorder.   PAST MEDICAL HISTORY: End-stage renal disease, now on hemodialysis for several months. Also has congestive heart failure and high blood pressure. Recent pain appears from some orthopedic injury.   SOCIAL HISTORY: The patient lives with her husband. The two of them have been married for about 60 years. She has no children. I was not able to find the husband to interview him this time. As far as I know, there has been no change to her social situation.   SUBSTANCE ABUSE HISTORY: Does not drink, does not use drugs. No history of either one.   CURRENT MEDICATIONS: Ambien 5 mg at bedtime as needed for insomnia, pantoprazole 40 mg daily, ondansetron p.r.n. for nausea, morphine 2 to 4 mg IV every 2 hours as needed for severe pain, Zestril 5 mg daily, Lasix 40 mg IV q. 12 hours, Plavix 75 mg a day, Klonopin 0.5 mg q. 6 hours p.r.n. for anxiety, Coreg 3.125 mg twice a day, amiodarone 200 mg daily, Norco 5 mg q. 4 hours p.r.n. for moderate pain.   ALLERGIES: PENICILLIN.   REVIEW OF SYSTEMS: The patient had no complaints. Denied pain, denied hallucinations, denied any suicidality, denied nausea or any GI problems and did not even feel short of breath.   MENTAL STATUS EXAMINATION: Neatly groomed woman, looks her stated age. Awake,  alert, cooperative. Good eye contact. Psychomotor activity normal. Speech a little bit decreased in total amount, but not dramatically so, overall really nearly normal rate, tone, and volume. Affect is smiling and upbeat and reactive. Mood is stated as being fine. Thoughts are rambling, disorganized and frequently confabulated. The patient will talk at great length about events that happened in the past and will confuse them with her current situation. Denies any suicidal or homicidal  ideation. Denies any hallucinations. She was able to repeat 3 words with several tries, but could not remember any of them after 2 to 3 minutes. The patient did not know where she was, did not know why she was here. She was not able to tell me what the word "dialysis" means and she could not tell me whether or not she had ever had dialysis. When I showed her her dialysis catheter, she could not tell me what its use was or even what sort of procedure was ever done with it.  LABORATORY RESULTS: Admission CKs normal. Troponins a little bit up. On the chemistry panel, creatinine is up at 3.7, potassium was normal, sodium normal. Low hematocrit and hemoglobin, 34.7 and 10.6, normal white count.   VITAL SIGNS: Blood pressure currently 146/76, respirations 20, pulse 74, temperature 97.7.   ASSESSMENT: The patient with dementia which is progressing. She is even more confused and demented this time than she was last time. She is very pleasant to talk to, but has no idea what is going on and no understanding at all could be demonstrated of her illness. It would be not surprising if she were to evidence some sundowning or delirium under stress or in the hospital. I am not sure if or when she ever gets any of the Klonopin, but it would probably be good to minimize or get rid of benzodiazepines. I will cut the dosage she is getting in half. I am also going to restart the Aricept, this time at 10 mg a day, since she tolerated the 5 mg fine. I will put in a p.r.n. for a low dose of an antipsychotic if she becomes agitated in the evening.   The patient lacks any understanding of any of her medical situation and clearly lacks any capacity to make any kind of medical decisions for herself.   DIAGNOSIS, PRINCIPAL AND PRIMARY:  AXIS I: Dementia, Alzheimer's type primarily.   SECONDARY DIAGNOSES: AXIS I: No further diagnosis.  AXIS II: Deferred.  AXIS III: Congestive heart failure, end-stage renal disease on dialysis,  high blood pressure.   ____________________________ Audery AmelJohn T. Adonna Horsley, MD jtc:sb D: 04/19/2014 14:32:20 ET T: 04/19/2014 14:54:25 ET JOB#: [id number]  cc: Audery AmelJohn T. Nancee Brownrigg, MD, <Dictator> Audery AmelJOHN T Deya Bigos MD ELECTRONICALLY SIGNED 05/03/2014 10:35

## 2014-06-26 NOTE — Consult Note (Signed)
PATIENT NAME:  [name], [name] DATE OF BIRTH:  [birth year]  DATE OF CONSULTATION:  04/18/2014  REFERRING PHYSICIAN:  Aram BeechamJeffrey Sparks, MD CONSULTING PHYSICIAN:  Marylin Lathon D. Juliann Paresallwood, MD  PRIMARY CARE PHYSICIAN: Dr. Thedore MinsSingh  INDICATION: Congestive heart failure.   HISTORY OF PRESENT ILLNESS: [name] is an 81 year old female with end-stage renal disease on hemodialysis Monday, Wednesday and Friday, history of coronary artery disease status post myocardial infarction, history of chronic congestive heart failure, not on oxygen at home, came to the Emergency Room with worsening shortness of breath as well as chest pain through her back. The patient was noted to have congestive heart failure with slightly elevated troponin. She was slightly hypoxic requiring oxygen during her evaluation so she was advised to be admitted for further evaluation and care. Denies missing any dialysis treatments.   PAST MEDICAL HISTORY:  1.  End-stage renal disease, on dialysis. 2.  Arteriosclerotic cardiovascular disease status post myocardial infarction x2. 3.  Congestive heart failure, systolic and diastolic. 4.  Lung nodule. 5.  Glaucoma. 6.  TIAs.  7.  Hypertension. 8.  Hyperlipidemia. 9.  Nephrolithiasis. 10.  Cholelithiasis.   MEDICATIONS: Meloxicam 7.5 daily, lisinopril 5 a day, iron 324 twice a day, Flexeril 5 mg twice a day, Plavix 75 a day, Klonopin 0.5 twice a day, Coreg 3.125 twice a day, amiodarone 200 mg daily, Norco 5/325 every 4 hours as needed.   ALLERGIES: PENICILLIN.   SOCIAL HISTORY: Negative for alcohol, tobacco abuse.   FAMILY HISTORY: Hypertension, coronary artery disease, stroke.   REVIEW OF SYSTEMS: Denies any blackouts spells or syncope. Denies nausea or vomiting. No fever. No chills. No sweats. No weight loss. No weight gain. No hemoptysis or hematemesis. No bright red blood per rectum. She complained of chest pain, shortness of breath, some congestion.   PHYSICAL  EXAMINATION: VITAL SIGNS: Blood pressure 150/90, pulse 80, respiratory rate 16, afebrile.  HEENT: Normocephalic, atraumatic. Pupils equal and reactive to light.  NECK: Supple. No significant JVD, bruits or adenopathy.  LUNGS: Clear to auscultation and percussion. No significant wheezing, rhonchi, or rale.  HEART: Regular rate and rhythm. Systolic ejection murmur at the apex. Positive S4. ABDOMEN: Benign. EXTREMITIES: Within normal limits.  NEUROLOGIC: Intact.  SKIN: Normal   DIAGNOSTIC DATA: EKG: Sinus rhythm, bundle branch block.   Chest x-ray: Congestive heart failure.   Troponin 0.17. Glucose 102, BUN 31, creatinine 2.7. White count 10, hemoglobin 10.6.   ASSESSMENT:  1.  Acute on chronic congestive heart failure. 2.  Elevated troponin. 3.  End-stage renal disease. 4.  Chest pain, possible angina.  5.  Arteriosclerotic cardiovascular disease. 6.  Hypertension. 7.  Hyperlipidemia. 8.  Degenerative joint disease. 9.  Mild anxiety.   PLAN: Agree with admit. Treat for heart failure. Continue dialysis. Recommend nephrology input. Try diuresis if possible. Consider fluid restriction. Lasix to see if it helps with diuresis. Followup cardiac enzymes. Followup EKG. Consider echocardiogram. Inhalers as necessary. Continue supplemental oxygen. Restrict sodium in diet. Continue aggressive hypertension control. Continue current therapy to see if the patient responds. Ultimately fluid management must be obtained with dialysis therapy and strict blood pressure control. We will continue to follow the patient, but await nephrology input. ____________________________ Bobbie Stackwayne D. Juliann Paresallwood, MD ddc:sb D: 04/19/2014 07:22:58 ET T: 04/19/2014 08:49:15 ET JOB#: [id number]  cc: Maxey Ransom D. Juliann Paresallwood, MD, <Dictator> Alwyn PeaWAYNE D Gilma Bessette MD ELECTRONICALLY SIGNED 05/15/2014 17:35

## 2014-06-26 NOTE — Discharge Summary (Signed)
PATIENT NAME:  [name], Alainah S MR#:  [id number] DATE OF BIRTH:  04-12-1923  DATE OF ADMISSION:  05/03/2014 DATE OF DISCHARGE:  05/06/2014  DISCHARGE DIAGNOSES:  1.  End-stage renal disease. 2.  Acute on chronic diastolic heart failure.  3.  Multiple recurrent medical admissions, admitted 6 times in the last 6 months.   DISCHARGE DISPOSITION: To hospice home.    CODE STATUS: DNR.   CONSULTATIONS: Palliative care consult with Dr. Harvie JuniorPhifer, and also hospice evaluation. She was also seen by nephrology from Dr. Cherylann RatelLateef.   HOSPITAL COURSE: This is an 81 year old female patient admitted because of generalized weakness and hypoxia. The patient was found to have O2 saturation 70% by the home care nurse, and she also had a troponin elevation of 4.25, so she was admitted for acute non-ST-elevation MI and acute on chronic diastolic heart failure.  1.  Regarding the MI, the patient was started on high-dose statins, aspirin and beta blockers, and heparin drip was started. The patient continued on heparin drip for 48 hours after discussing the risks and benefits with the family. The patient did not have any bleeding issues. The patient finished heparin drip for 48 hours. We continued the patient on aspirin, statins, beta blockers, and ACE inhibitors. The patient did not have any further chest pain. The patient did not have other symptoms.  2.  Acute on chronic diastolic heart failure. The patient was seen by Dr. Lady GaryFath regarding her MI and heart failure. The patient is not a candidate for aggressive intervention because of multiple problems, so she was continued on heparin for 2 days. Previously, before she came to the hospital, she was on aspirin and Plavix that were stopped because of concern for GI bleed, but after discussing with the patient and the patient's family, the patient was started on heparin drip. The patient had an echocardiogram on previous admission on 04/17/2014. EF 20% to 25% with severely decreased  LV systolic function. 3.  ESRD on hemodialysis. The patient was seen by Dr. Wynelle LinkKolluru, and the patient is on hemodialysis Monday, Wednesday, and Friday. Because of her recurrent admissions and worsening baseline status, the family decided not to continue dialysis and transfer her to hospice home. The patient has secondary hyperparathyroid secondary to ESRD. 4.  She has other problems of cardiomyopathy with AICD, history of CVA, and poor functional capacity getting recurrent multiple admissions. Concerning this, palliative care has seen the patient. The patient's family, especially the husband and other family members, decided to stop dialysis and transfusion to hospice home. So, the patient went to hospice home today.    TIME SPENT ON DISCHARGE PREPARATION: More than 30 minutes.     ____________________________ Katha HammingSnehalatha Ludy Messamore, MD sk:at D: 05/06/2014 13:49:00 ET T: 05/06/2014 20:16:14 ET JOB#: [id number]  cc: Lennox PippinsMunsoor N. Lateef, MD Darlin PriestlyKenneth A. Lady GaryFath, MD Katha HammingSnehalatha Kerryn Tennant, MD, <Dictator>   Katha HammingSNEHALATHA Ronan Duecker MD ELECTRONICALLY SIGNED 05/13/2014 13:41

## 2014-06-26 NOTE — Consult Note (Signed)
PATIENT NAME:  [name], WREN MR#:  [id number] DATE OF BIRTH:  May 08, 1923  DATE OF CONSULTATION:  03/16/2014  REFERRING PHYSICIAN:  Dr. Cherlynn Kaiser  CONSULTING PHYSICIAN:  Maryagnes Amos, MD  I have been asked by Dr. Cherlynn Kaiser to evaluate this pleasant, elderly female for mid-back pain.   HISTORY OF PRESENT ILLNESS:  Briefly, she is an 81 year old female with multiple medical problems, including ischemic cardiomyopathy, hypertension, hyperlipidemia, coronary artery disease, end-stage renal disease requiring hemodialysis 3 times a week, history of dementia, and prior TIAs who presented to the Emergency Room early this morning complaining of a several day history of right-sided lower back pain. She was noted to have a T6 compression fracture on a chest x-ray. The chest x-ray also showed a T12 compression fracture. A CT scan of the abdomen covered the T12 compression fracture but it was felt to be old, according to the radiologist. The patient was admitted for pain management and further work-up of her compression fracture.  The patient apparently lives at home with her husband. She ambulates with a cane or by holding onto objects around the house. She has been advised to use a walker in the past but has chosen not to do so. She denies any recent falls that may have contributed to the onset of her present symptoms.    PHYSICAL EXAMINATION:  We have a pleasant frail-appearing elderly female resting comfortably in bed. She is alert and oriented x3. Examination of her back demonstrates the skin to be unremarkable. She has mild tenderness to percussion over the mid and lower thoracic region. She does not have any pain along the lumbar spine. She can roll in bed without much discomfort. Examination of both lower extremities demonstrates pain-free motion of both hips, knees, ankles, and feet. She is able to actively dorsiflex and plantarflex her toes, but does have weakness with ankle dorsiflexion bilaterally. She  also has subjectively decreased sensation to light touch to the plantar more so than dorsal forefeet bilaterally, consistent with her history of peripheral neuropathy. Of note, the patient does wear bilateral AFOs for ambulation.   X-RAY DATA:  As noted above.   IMPRESSION: T6 compression fracture with old T12 compression fracture.   PLAN:  The treatment options are discussed with the patient.  I have also discussed the case with [place] who has a strong interest in vertebral compression fractures and will frequently perform a vertebroplasty. We agreed that the patient is not a surgical candidate at this time, so it would be best to try to treat her nonsurgically if at all possible. She will be fitted with a lumbosacral corset to wear during ambulation. The patient is somewhat ambivalent about wearing the lumbosacral corset due to discomfort as well as the ability to don and off the brace.  The nurse will work with her on wearing this to see how she can tolerate it. Of note, the patient's nurse does state that she has been able to get around reasonably well with a walker today and has not had too much pain. The foot pain she has experienced has been treated well with tramadol.   Thank you for asking me to participate in the care of this most pleasant woman. Dr. Rosita Kea has offered to have her follow up in his office in 2 to 3 weeks for re-evaluation and possible reconsideration of vertebroplasty if she is not progressing with her symptoms.    ____________________________ Shela Commons. Derald Macleod, MD jjp:at D: 03/16/2014 17:20:26 ET T: 03/16/2014 17:35:23  ET JOB#: [id number]  cc: Maryagnes AmosJ. Jeffrey Kearsten Ginther, MD, <Dictator> JEFF Fidel LevyJ Zsofia Prout MD ELECTRONICALLY SIGNED 03/22/2014 9:15

## 2014-06-26 NOTE — Consult Note (Signed)
   Present Illness 81 yo female with history of dementia, esrd on hd, histor of cardioyopathy with ef of 20% who was admitted with weakness and fatigue and was noted to have hypoxia and was brought to the er. In the er she was noted to have a serum troponin of 4.25. She denies chest pain or sob at present and she states she is not sure why she is in the hospital. She was recently taken off of asa and plavix by her pcp sue to rectal bleeding. EKG does not reveal evidence of ischemia. Heparin was initially deferred by the patients husband but he has stated he will allow the heparin to be started. ASA and plavix were deferred. She has had multiple admission recently with sob, hypoxia and chf . Several weeks ago she was admitted with mild troponin elevation.   Physical Exam:  GEN no acute distress   HEENT PERRL   NECK No masses   RESP no use of accessory muscles   CARD Regular rate and rhythm   ABD denies tenderness   LYMPH negative neck   EXTR negative edema   SKIN positive rashes   NEURO cranial nerves intact, motor/sensory function intact   PSYCH A+O to time, place, person   Review of Systems:  Subjective/Chief Complaint denies chest pain or sob but is a difficult historian   General: No Complaints   Skin: No Complaints   ENT: No Complaints   Eyes: No Complaints   Neck: No Complaints   Respiratory: No Complaints   Cardiovascular: No Complaints   Gastrointestinal: No Complaints   Genitourinary: No Complaints   Vascular: No Complaints   Musculoskeletal: No Complaints   Neurologic: No Complaints   Hematologic: No Complaints   Endocrine: No Complaints   Psychiatric: No Complaints   Review of Systems: All other systems were reviewed and found to be negative   Medications/Allergies Reviewed Medications/Allergies reviewed   Family & Social History:  Family and Social History:  Family History Non-Contributory   Social History negative tobacco   EKG:   Interpretation nsr with no ischemia    PCN: Rash   Impression 81 yo female with esrd on hd, dementia, cardiomyopathy with ef of 20%, who was admitteed with progressive hypoxia. Pt is a very difficult historian and is not aware of why she has been admitted. She was noted to have an elevated serum troponin of 4.25. No ischmeia on ekg. She was hypoxic prior to adission. She had been taken off of her dual antiplatelet therapy due to rectal bleeidng. Her elevated serum troponin is likely secondary to renal insuffiency, hypoxia and possible underlying cad. Not an ideal candidate for invasive evaluation as she is not an ideal candidataed for invsive intervention either surgical or percutaneous. Would be high bleeding risk for resuming dual antiplaelet therapy which would be required for pci. Will attempt to control symtpoms medically   Plan 1. Heparin for at least 24 hours if tolerated 2. Hold asa and plavix 3. HD  4. Agree with paliative evalaution 5. Ruled in for nstemi but would not recommend cardiac cath at present.   Electronic Signatures: Dalia HeadingFath, Reggie Welge A (MD)  (Signed 08-Mar-16 20:35)  Authored: General Aspect/Present Illness, History and Physical Exam, Review of System, Family & Social History, EKG , Allergies, Impression/Plan   Last Updated: 08-Mar-16 20:35 by Dalia HeadingFath, Kathleene Bergemann A (MD)

## 2014-06-26 NOTE — H&P (Signed)
PATIENT NAME:  [name], DOFFING MR#:  [id number] DATE OF BIRTH:  06-04-1923  DATE OF ADMISSION:  03/16/2014  REFERRING PHYSICIAN: Lurena Joiner L. Shaune Pollack, MD  PRIMARY CARE PRACTITIONER: Alan Mulder, MD   ADMITTING PHYSICIAN: Crissie Figures, MD    CHIEF COMPLAINT: Back pain on the right side.   HISTORY OF PRESENT ILLNESS: An 81 year old Caucasian female with a past medical history of hypertension; hyperlipidemia; coronary artery disease; ischemic cardiomyopathy with  ejection fraction of 25%, status post AICD placement; end-stage renal disease on hemodialysis Monday, Wednesday, and Friday; history of dementia; TIA, presents to the Emergency Room with the complaints of back pain on the right side, which started about 1-2 days ago. The patient states she was in her usual state of health and says she developed the back pain on the right side, hence came to the Emergency Room for further evaluation. Denies any chest pain. No nausea, no vomiting, no diarrhea, no cough, no fever. No urinary symptoms such as hematuria, frequency, urgency, dysuria. In the Emergency Room, the patient was evaluated by the ED physician and was noted to have a chest x-ray, which showed T6 compression fracture, which is kind of new. The patient underwent a CT of the abdomen which showed T12 compression fracture, which is old, and also had a 17 mm nodule in the left lower lung. The patient received multiple doses of IV pain medications in the Emergency Room despite which she continues to have significant pain hence, hospitalist service was consulted for further pain management and admission. The patient has a baseline dementia and is able to just communicate it to a certain extent but not able to give a detailed history at this time. The patient is a pleasant and cooperative and resting in the bed and states that now her back pain is under control with IV pain medications.    PAST MEDICAL HISTORY:  1.  Hypertension.  2.   Hyperlipidemia.  3.  Coronary artery disease.  4.  Ischemic cardiomyopathy with ejection fraction of 25%.  5.  AICD placement.  6.  End-stage renal disease on hemodialysis Monday, Wednesday, Friday.  7.  Dementia.  8.  TIA.   PAST SURGICAL HISTORY: None, as per the old chart. The patient not able to give any history because of baseline dementia but history of AICD placement.   ALLERGIES: PENICILLIN.   SOCIAL HISTORY: She is married, lives at home with her husband. No history of any smoking, alcohol, or drug usage.   FAMILY HISTORY: Positive for coronary arteries in the family members.   HOME MEDICATIONS:  1.  Amiodarone 200 mg tablet 1 tablet orally once a day.  2.  Carvedilol 3.125 mg 1 tablet orally 2 times a day.  3.  Clonazepam 0.5 mg tablet orally 1 tablet 2 times a day as needed for anxiety.  4.  Plavix 75 mg 1 tablet orally once a day.  5.  Donepezil 5 mg tablet 1 tablet orally once a day.  6.  Ferrous gluconate 324 mg 2 tablets orally once a day.  7.  Hydroxyzine hydrochloride oral syrup 4 times a day as needed for itching.  8.  Lisinopril 5 mg 1 tablet orally once a day.  9.  Meloxicam 7.5 mg 1 tablet orally once a day.  10.  Tramadol 150 mg oral tablet 1 tablet 3 times a day as needed for pain.   REVIEW OF SYSTEMS:  CONSTITUTIONAL: Negative for fever or chills. No fatigue. No generalized weakness.  EYES: Negative for blurred vision, double vision. No pain. No redness. No discharge.  EARS, NOSE, THROAT: Negative for tinnitus, ear pain, hearing loss, epistaxis, nasal discharge, difficulty swallowing.  RESPIRATORY: Negative for cough, wheezing, dyspnea, hemoptysis, painful respiration.  CARDIOVASCULAR: Negative for chest pain, palpitations, dizziness, syncopal episodes, orthopnea, dyspnea on exertion, or pedal edema.  GASTROINTESTINAL: Negative for nausea, vomiting, diarrhea, abdominal pain, hematemesis, melena, rectal bleeding, GERD symptoms.  GENITOURINARY: Negative for  dysuria, frequency, urgency, or hematuria.  ENDOCRINE: Negative for polyuria, nocturia, heat or cold intolerance.  HEMATOLOGIC: Negative for anemia, easy bruising, bleeding, swollen glands.  INTEGUMENTARY: Negative for acne, skin rash, or lesions.  MUSCULOSKELETAL: Positive for back pain on the right side, as noted in the history of present illness.  NEUROLOGICAL: Negative for focal weakness or numbness. No history of CVA, TIA, seizure disorder. History of dementia.  PSYCHIATRIC: Negative for anxiety, insomnia, or depression.   PHYSICAL EXAMINATION:  VITAL SIGNS: Temperature 98.3 degrees Fahrenheit, pulse 62 per minute, respirations 18, and monitor blood pressure 168/78, oxygen saturation is 99% on room air.  GENERAL: Well nourished, alert and oriented, in no acute distress, comfortably resting in the bed, pleasant and cooperative for examination.  HEAD: Atraumatic, normocephalic.  EYES: Pupils are equal, react to light and accommodation. No conjunctival pallor. No icterus. Extraocular movements intact.  NOSE: No drainage. No lesions.  EARS: No drainage. No external lesions.  ORAL CAVITY: No mucosal lesions. No exudates. NECK: Supple. No JVD. No thyromegaly. No bruit. Range of motion of neck within normal limits.  RESPIRATORY: Good respiratory effort. Not using accessory muscles of respiration. Bilateral vesicular breath sounds present. No rales or rhonchi.  CARDIOVASCULAR: S1, S2 regular. No murmurs appreciated. Peripheral pulses equal at carotid, femoral, pedal pulses. No peripheral edema.  GASTROINTESTINAL: Abdomen is soft and nontender. No hepatosplenomegaly. No masses. No rigidity. No guarding. Bowel sounds present and equal in all 4 quadrants.  GENITOURINARY: Deferred.  MUSCULOSKELETAL: Positive for some mild tenderness on the right lower back. Otherwise no joint tenderness. Range of motion adequate. Strength and tone equal bilaterally.  SKIN: Inspection within normal limits.  LYMPHATIC:  No cervical lymphadenopathy.  VASCULAR: Good dorsalis pedis and posterior tibial pulses.  NEUROLOGICAL: Alert, awake, and oriented x 3. Cranial nerves II-XII grossly intact. No sensory defects. Motor strength 5/5 in both upper and lower extremities. DTRs 2+ bilaterally and symmetrical. Plantars downgoing.  PSYCHIATRIC: Alert, awake, and oriented x 3. Judgment and insight are adequate. Memory mild impairment because of dementia.   LABORATORY DATA: Serum glucose 102, BUN 23, creatinine 2.3, sodium 137, potassium 4.6, chloride 102, bicarbonate 29, total calcium 8.7, total protein 6.8, albumin 3.3, total bilirubin 0.4, alkaline phosphatase 103, AST 46, ALT 22. WBC 6.8, hemoglobin 12.0, hematocrit 39.1, platelet count 197,000.   IMAGING STUDIES:  1.  Chest x-ray: New T6 compression deformity when compared to 01/26/2014. Otherwise, no acute cardiopulmonary pathology.  2.  CT of the abdomen and the pelvis: No hydronephrosis or stones. Cholelithiasis without evidence of acute cholecystitis. A 17 mm nodule in the left lower lobe, which is enlarged from 2014, likely reflecting a bronchogenic carcinoma.    EKG: Normal sinus rhythm with ventricular rate of 71 beats per minute. No acute ST-T changes.    ASSESSMENT AND PLAN: An 81 year old Caucasian female with a past medical history of ischemic cardiomyopathy with ejection fraction of 25%, status post automatic implantable cardiac defibrillator placement; history of coronary artery disease; end-stage renal disease on hemodialysis Monday, Wednesday, and Friday; history of hypertension, hyperlipidemia,  dementia, transient ischemic attack, presents with the right side back pain, found to have new compression vertebral fracture T6 on chest x-ray. CT of the abdomen also reveals a 17 mm left lower lung nodule concerning for bronchogenic carcinoma admitted for pain control and further management.  1.  Back pain requiring multiple doses of intravenous pain medications:  Back pain, likely secondary due to vertebral compression fracture at T6 which is evident on chest x-ray. PLAN: Admit to medical floor. Pain control measures. Physical therapy consult and consider pain management accordingly.  2.  Vertebral compression fracture T6 level. PLAN: Pain control. Physical therapy evaluation. Consider orthopedic evaluation.  3.  Left lower lung nodule by CT scan: No symptoms. Needs further evaluation since it is concerning for bronchogenic carcinoma. PLAN: Oncology consultation requested for further evaluation.  4.  End-stage renal disease on hemodialysis Monday, Wednesday, and Friday: Stable clinically, creatinine 2.2. PLAN: Renal consultation for maintenance hemodialysis.  5.  Hypertension: Controlled on home medications. Continue same.  6.  History of coronary artery disease: Stable clinically. Continue same.  7.  History of ischemic cardiomyopathy with ejection fraction of 25% status post automatic implantable cardiac defibrillator: The patient stable on home medications. Continue home medications.  8.  History of dementia: Stable on home medications. Continue same and supportive care.  9.  Deep vein thrombosis prophylaxis: Subcutaneous heparin.  10.  Gastrointestinal prophylaxis: Proton pump inhibitor.   CODE STATUS: Full code.    TIME SPENT: 55 minutes.    ____________________________ Crissie FiguresEdavally N. Aasha Dina, MD enr:bm D: 03/16/2014 02:06:29 ET T: 03/16/2014 03:31:05 ET JOB#: [id number]  cc: Crissie FiguresEdavally N. Aeris Hersman, MD, <Dictator> Alan MulderShamil J. Morayati, MD Crissie FiguresEDAVALLY N Siraj Dermody MD ELECTRONICALLY SIGNED 03/16/2014 20:03

## 2014-06-26 NOTE — Consult Note (Signed)
Note Type Consult   HPI: Referred by PCP  Dr. Francoise Schaumann. referring physicians  Dr. Gladstone Lighter.  Subjective: Chief Complaint/Diagnosis:   [age] year old lady with multiple medical problems now has left lower lobe   lung  nodule 17 mm in size which has grown slightly since 1384 HPI:   81 year old lady was admitted in the hospital with severe back pain which was not relieved by intravenous pain medication.had at compression fracture of T2.  Which is now being evaluated with MRI scanis very poor historian.  She is on chronic dialysis for end-stage renal disease denies any history of cough, chest pain, hemoptysis.has smoked in the past.   Review of Systems:  General: weakness  fatigue  due to multiple medical illnesses  Performance Status (ECOG): 2  HEENT: no complaints  Lungs: no complaints  Cardiac: no complaints  GI: no complaints  GU: no complaints  Musculoskeletal: back pain  muscle ache  joint pain  Extremities: no complaints  Skin: no complaints  Neuro: no complaints  Review of Systems:   patient  has mild dementia.  Allergies:  PCN: Rash  Significant History/PMH:   Dialysis:    Gallstones:    scarlet fever:    Glaucoma:    TIA:    MI x2:    Hypercholesterolemia:    HTN:    kidney stone:    lithrotripsy:   PFSH: Family History: noncontributory  Social History: negative alcohol, negative tobacco  Additional Past Medical and Surgical History: PAST MEDICAL HISTORY:   1.  Hypertension.   2.  Hyperlipidemia.   3.  Coronary artery disease.   4.  Ischemic cardiomyopathy with ejection fraction of 25%.   5.  AICD placement.   6.  End-stage renal disease on hemodialysis Monday, Wednesday, Friday.   7.  Dementia.   8.  TIA.     PAST SURGICAL HISTORY: None, as per the old chart. The patient not able to give any history because of baseline dementia but history of AICD placement   Home Medications: Medication Instructions Last Modified Date/Time  tramadol 50 mg  oral tablet 1  orally 3 times a day, As Needed 20-Jan-16 08:57  hydrOXYzine hydrochloride 10 mg/5 mL oral syrup 10 milliliter(s) orally 4 times a day, As Needed - for Itching 20-Jan-16 08:57  clonazePAM 0.5 mg oral tablet 1 tab(s) orally 2 times a day, As Needed for anxiety 20-Jan-16 08:57  ferrous gluconate 324 mg oral tablet 2 tab(s) orally once a day 20-Jan-16 08:57  donepezil 5 mg oral tablet 1 tab(s) orally once a day (at bedtime) 20-Jan-16 08:57  meloxicam 7.5 mg oral tablet 1 tab(s) orally once a day 20-Jan-16 08:57  clopidogrel 75 mg oral tablet 1 tab(s) orally once a day with breakfast 20-Jan-16 08:57  amiodarone 200 mg oral tablet 1 tab(s) orally once a day 20-Jan-16 08:57  lisinopril 5 mg oral tablet 1 tab(s) orally once a day 20-Jan-16 08:57  carvedilol 3.125 mg oral tablet 1 tab(s) orally 2 times a day 20-Jan-16 08:57   Vital Signs:  :: been reviewed from multiple  nurses notes   Physical Exam:  General: patient is lying in the bed and in dialysis unit  Mental Status: Somewhat confused  Eyes: pupils  are equal reacting to light.    No jaundice  palelooking sclera  Head, Ears, Nose,Throat: normal, no lesions or deformities  Respiratory: no rales, rhonchi, retractions or wheezing  Cardiovascular: irregular heart sounds.  Soft systolic murmur.  Gastrointestinal: abdomen is soft.  Liver  and spleen not palpable.  No ascites.  No tenderness.  Bowel sounds are normal  Musculoskeletal: no swelling  of the joint.  Lower extremity no swelling  Skin: no rash.  No ecchymosis.no skin lesions.  Neurological: patient has dementia.  Keeps on repeating same thing again.  No other focal sign.  Lymphatics: no cervical, axillary, or inguinal lymphadenopathy   Lab Results Review:  Lab Results   BMP  :  16-Feb-2014 04:11:  Last Resulted Date/Time.136 K+: 4.0 CO2: 32 Chl: 99 BUN: 19(H) Cre: 2.53(H) Glu:124(H) Calcium: 7.9(L) . Metabolic Panel  :  [id number] 21:10:  Last Resulted  Date/Time.137 K+: 4.6 CO2: 29 Chl: 102 BUN: 23(H) Cre: 2.30(H) Glu:102(H) Calcium: 8.7 Alk Phos: 103 ALT: 22 AST: 46(H) T.Prot: 6.8 Alb: 3.3(L) eGFR-AA: 26(L) eGFR: 21(L) .   Radiology Results: LabUnknown:    06-Dec-15 10:25, CT Abdomen and Pelvis Without Contrast  PACS Image     19-Jan-16 22:26, CT Abdomen Pelvis WO for Stone  PACS Image   CT:    06-Dec-15 10:25, CT Abdomen and Pelvis Without Contrast  CT Abdomen and Pelvis Without Contrast   REASON FOR EXAM:    (1) colicky abd pain eval; (2) colicky abd pain eval  COMMENTS:       PROCEDURE: CT  - CT ABDOMEN AND PELVIS W0  - Jan 30 2014 10:25AM     CLINICAL DATA:  Abdominal pain    EXAM:  CT ABDOMEN AND PELVIS WITHOUT CONTRAST    TECHNIQUE:  Multidetector CT imaging of the abdomen and pelvis was performed  following the standard protocol without IV contrast.    COMPARISON:  04/25/2012  FINDINGS:  Lack of intravenous contrast severely limits the study.    Small bilateral pleural effusions. Bibasilar hazy opacities  worrisome for airspace disease.    Cholelithiasis.    Unenhanced liver, spleen, pancreas are grossly within normal limits.  Adrenal glands are prominent and ill-defined. There is stranding in  the retroperitoneal fat. These findings suggest edema. Calcification  in the right adrenal gland is stable.    Bilateral calcifications in the central kidneys are stable. These  are either vascular calcifications or nephrolithiasis. Hyperdense  focus in the right kidney on image 19 is stable. Left renal cysts on  image 25 is stable.    Sigmoid diverticulosis.  Bladder is unremarkable.    No free-fluid. No retroperitoneal hemorrhage. Left hip arthroplasty.    Advanced degenerative disc disease at L4-5 with marked  anterolisthesis and vacuum disc. T12 compression fracture of  indeterminate age. 4 mm retrolisthesis of the superior endplate. 50%  loss of height anteriorly. No definite acute fracture lines. It has  a  chronic appearance.     IMPRESSION:  Cholelithiasis.  Bilateral pleural effusions. There is probably bibasilar associated  airspace disease.    T12 compression fracture of indeterminate age. It does have a  chronic appearance.    Other chronic changes are noted.      Electronically Signed    By: Maryclare Bean M.D.    On: 01/30/2014 11:04         Verified By: Jamas Lav, M.D.,    19-Jan-16 22:26, CT Abdomen Pelvis WO for Stone  CT Abdomen Pelvis WO for Stone   REASON FOR EXAM:    RIGHT FLANK PAIN, HX OF STONES  COMMENTS:       PROCEDURE: CT  - CT ABDOMEN /PELVIS WO Joaquim Lai)  - Mar 15 2014 10:26PM     CLINICAL DATA:  Right flank pain.  History of calculi    EXAM:  CT ABDOMEN AND PELVIS WITHOUT CONTRAST    TECHNIQUE:  Multidetector CT imaging of the abdomen and pelvis was performed  following the standard protocol without IV contrast.    COMPARISON:  01/30/2014  FINDINGS:  BODY WALL: Unremarkable.    LOWER CHEST: Chronic cardiomegaly. There is a small left pleural  effusion with mild basilar atelectasis.    Lobulated nodule in the left lower lobe measuring 17 mm. This has  grown since 2014.    ABDOMEN/PELVIS:    Liver: Dense appearance of the liver. Given the cardiomegaly,  question amiodarone changes.    Biliary: Cholelithiasis.  No evidence of acute cholecystitis  Pancreas: Unremarkable.    Spleen: Unremarkable.    Adrenals: Unremarkable.    Kidneys and ureters: Bilateral renal hilar calcifications, most  likely arterial. No hydronephrosis or ureteral calculus. There is a  chronic hemorrhagic cyst within the upper right kidney. Renal  atrophy, more extensive on the right.    Bladder: Moderately distended.    Reproductive: Hysterectomy.  Bowel: No obstruction. Colonic diverticulosis without active  inflammation.    Retroperitoneum: No mass or adenopathy.    Peritoneum: No ascites or pneumoperitoneum.    Vascular: No acute  abnormality.    OSSEOUS: Left hip arthroplasty. No adverse findings. Remote T12  compression fracture with moderate height loss and mild  retropulsion. Advanced degenerative disc and facet arthropathy at  L4-5 with grade 1 slip.     IMPRESSION:  1. No hydronephrosis or ureteral calculus.  2. Cholelithiasis without evidence of acute cholecystitis.  3. 17 mm nodule in the left lower lobe which has enlarged from 2014.  This likely reflects a bronchogenic carcinoma, PET-CT and/or biopsy  recommended.      Electronically Signed    By: Tiburcio Pea M.D.    On: 03/15/2014 22:38         Verified By: Fredrich Birks.,   Assessment and Plan: Impression:   1.patient has left lower lobe lung nodule which is 17 mm in size is lobulated mass and in comparison to the CT scan of 2014 has increased in size slightly.of this being primary lung cancer versus metastatic disease cannot be ruled outwe proceed with extensive workup in the sake 81 year old lady with a PET scan revealed needle biopsy I would like to sit down with the family and discuss possible options  of treatment likely radiation treatment or surgical option and whether patient or family desires to proceed with this approach or not.  Considering the very minimal growth in more than years and considering patient's underlying multiple medical problems and dementia we may want to just watch this nodule with another CT scan in 6 month Hold off any further evaluation at present time  Pain most likely secondary to compression fracture in the spineis pending which will be evaluated.  If MRI shows any bone destruction or soft tissue mass then biopsy can be arrangedand light chain to rule out any evidence of multiple myeloma   Fax to Physician:  Physicians To Recieve Fax: Alan Mulder - [phone].  Electronic Signatures: Laddie Aquas (MD)  (Signed 20-Jan-16 16:40)  Authored: Note Type, History of Present Illness, CC/HPI, Review  of Systems, ALLERGIES, PAST MEDICAL HISTORY, Patient Family Social History, HOME MEDICATIONS, Vital Signs, Physical Exam, Lab Results Review, Rad Results Review, Assessment and Plan, Fax to Physician   Last Updated: 20-Jan-16 16:40 by Laddie Aquas (MD)

## 2014-06-26 NOTE — H&P (Signed)
PATIENT NAME:  [name], [name] MR#:  [id number] DATE OF BIRTH:  [birth year]  DATE OF ADMISSION:  04/17/2014  REFERRING PHYSICIAN: Charlestine NightPhillip A. Scotty CourtStafford, M.D.   FAMILY PHYSICIAN: Alan MulderShamil J. Morayati, M.D.   REASON FOR ADMISSION: Acute on chronic congestive heart failure.   HISTORY OF PRESENT ILLNESS: The patient is an 81 year old female with end-stage renal disease on hemodialysis every Monday, Wednesday, Friday. Also has a history of coronary artery disease status post MI. Has chronic CHF, but is not on oxygen at home. Presents to the Emergency Room this morning with worsening shortness of breath, as well as chest pain radiating through to the back. In the Emergency Room, the patient was noted to be in congestive heart failure. She was also noted to have an elevated troponin. She was hypoxic requiring oxygen and is now admitted for further evaluation.   PAST MEDICAL HISTORY:  1. End-stage renal disease on hemodialysis.  2. ASCVD status post MI x 2.  3. Chronic combined systolic diastolic/congestive heart failure.  4. History of a lung nodule.  5. Glaucoma.  6. History of TIAs.  7. Benign hypertension.  8. Hyperlipidemia.  9. Nephrolithiasis.  10. Cholelithiasis.   MEDICATIONS:  1. Meloxicam 7.5 mg p.o. daily.  2. Lisinopril 5 mg p.o. daily.  3. Ferrous gluconate 324 mg p.o. b.i.d.  4. Flexeril 5 mg p.o. b.i.d. 5. Plavix 75 mg p.o. daily.  6. Klonopin 0.5 mg p.o. b.i.d.  7. Coreg 3.125 mg p.o. b.i.d.  8. Amiodarone 200 mg p.o. daily.  9. Norco 5/325 one p.o. q.4 h. p.r.n. pain.   ALLERGIES: PENICILLIN.   SOCIAL HISTORY: Negative for alcohol or tobacco abuse.   FAMILY HISTORY: Positive for hypertension, coronary artery disease and stroke.   REVIEW OF SYSTEMS:   CONSTITUTIONAL: No fever or change in weight.  EYES: No blurred or double vision. Does have glaucoma.  ENT: No tinnitus or hearing loss. No nasal discharge or bleeding. No difficulty swallowing.  RESPIRATORY: No cough or  wheezing. Denies hemoptysis.  CARDIOVASCULAR: The patient has had chest pain. No orthopnea. Denies palpitations or syncope.  GASTROINTESTINAL: No nausea, vomiting, or diarrhea. No abdominal pain. No change in bowel habits.  GENITOURINARY: No dysuria or hematuria. No incontinence.  ENDOCRINE: No polyuria or polydipsia. No heat or cold intolerance.  HEMATOLOGICAL: The patient denies anemia, easy bruising or bleeding.  LYMPHATIC: No swollen glands.  MUSCULOSKELETAL: The patient denies pain in her neck, shoulders, knees, hips. Does have back pain. No gout.  NEUROLOGIC: No numbness or migraines. Denies seizures.  PSYCHIATRIC: The patient denies anxiety, insomnia or depression.   PHYSICAL EXAMINATION:  GENERAL: The patient is chronically ill-appearing, but in no acute distress.  VITAL SIGNS: Remarkable for a blood pressure of 150/92 with a heart rate of 80, respiratory rate of 20, temperature of 97.9, saturation 98% on 2 liters.  HEENT: Normocephalic, atraumatic. Pupils equally round and reactive to light and accommodation. Extraocular movements are intact. Sclerae are not icteric. Conjunctivae are clear. Oropharynx is clear.  NECK: Supple without JVD. No adenopathy or thyromegaly is noted.  LUNGS: Reveal basilar rales bilaterally without wheezes or rhonchi. No dullness. Respiratory effort is normal.  CARDIAC: Regular rate and rhythm with a normal S1 and S2. No significant rubs or gallops. PMI is nondisplaced. Chest wall is nontender.  ABDOMEN: Soft, nontender, with normoactive bowel sounds. No organomegaly or masses were appreciated. No hernias or bruits were noted.  EXTREMITIES: Revealed stasis changes with trace edema bilaterally. Pulses were 2+ bilaterally.  SKIN:  Warm and dry without rash or lesions.  NEUROLOGIC: Revealed cranial nerves II through XII grossly intact. Deep tendon reflexes were symmetric. Motor and sensory examination is nonfocal.  PSYCHIATRIC: Revealed a patient who is alert and  oriented to person, place, and time. She was cooperative and used good judgment.   LABORATORY DATA: EKG revealed sinus rhythm with a left bundle branch block.   Chest x-ray showed congestive heart failure.   Her troponin was 0.17.   Glucose 102, with a BUN of 31, and a creatinine of 3.7, with a GFR of 12. Her white count was 10.3, with a hemoglobin of 10.6.   ASSESSMENT:  1. Acute on chronic combined congestive heart failure.  2. Elevated troponin.  3. Chest pain.  4. Atherosclerotic cardiovascular disease.  5. End-stage renal disease on hemodialysis.   PLAN: The patient will be admitted to telemetry as a FULL CODE. We will consult nephrology for continuing dialysis. We will begin IV Lasix, at this time, and follow serial cardiac enzymes. We will order an echocardiogram and consult cardiology. We will continue her outpatient medications for now. We will wean her oxygen as tolerated. Follow up routine laboratories and a chest x-ray in the morning. A 2-gram sodium diet for now. Further treatment and evaluation will depend upon [name] progress.   TOTAL TIME SPENT ON THIS PATIENT: Was 50 minutes.    ____________________________ Duane Lope Judithann Sheen, MD jds:JT D: 04/17/2014 14:43:48 ET T: 04/17/2014 15:14:19 ET JOB#: [id number]  cc: Duane Lope. Judithann Sheen, MD, <Dictator> Alan Mulder, MD Kari Kerth Rodena Medin MD ELECTRONICALLY SIGNED 04/17/2014 17:41

## 2014-06-26 NOTE — Consult Note (Signed)
Chief Complaint:  Subjective/Chief Complaint She states to be doing reasonably well denies shortness of breath point I am concerned about the dementia Dementia denies any shortness of breath but is not a reliable historian as seems to be resting comfortably in bed   VITAL SIGNS/ANCILLARY NOTES: **Vital Signs.:   24-Feb-16 08:40  Vital Signs Type Routine  Temperature Temperature (F) 97.8  Celsius 36.5  Temperature Source oral  Pulse Pulse 73  Respirations Respirations 19  Systolic BP Systolic BP 762  Diastolic BP (mmHg) Diastolic BP (mmHg) 66  Mean BP 84  Pulse Ox % Pulse Ox % 94  Pulse Ox Activity Level  At rest  Oxygen Delivery Room Air/ 21 %  *Intake and Output.:   Daily 24-Feb-16 07:00  Grand Totals Intake:  410 Output:  200    Net:  210 24 Hr.:  210  Oral Intake      In:  410  Urine ml     Out:  200  Length of Stay Totals Intake:  660 Output:  2750    Net:  -2090   Brief Assessment:  GEN well developed, well nourished, no acute distress   Cardiac Regular  murmur present   Respiratory normal resp effort  clear BS   Gastrointestinal Normal   Gastrointestinal details normal Soft  Nontender  Nondistended  No masses palpable   EXTR negative cyanosis/clubbing, negative edema   Lab Results: Hepatic:  24-Feb-16 10:09   Albumin, Serum  2.4  Routine Chem:  24-Feb-16 10:09   Glucose, Serum  102  BUN  37  Creatinine (comp)  3.04  Sodium, Serum 136  Potassium, Serum 4.2  Chloride, Serum 99  CO2, Serum 32  Calcium (Total), Serum 8.5  Phosphorus, Serum 3.4  Anion Gap  5  Osmolality (calc) 281  eGFR (African American)  19  eGFR (Non-African American)  15 (eGFR values <8mL/min/1.73 m2 may be an indication of chronic kidney disease (CKD). Calculated eGFR, using the MRDR Study equation, is useful in  patients with stable renal function. The eGFR calculation will not be reliable in acutely ill patients when serum creatinine is changing rapidly. It is not useful  in patients on dialysis. The eGFR calculation may not be applicable to patients at the low and high extremes of body sizes, pregnant women, and vegetarians.)  Routine Hem:  24-Feb-16 10:09   Hemoglobin (CBC)  11.0 (Result(s) reported on 20 Apr 2014 at 10:58AM.)   Radiology Results: XRay:    21-Feb-16 10:46, Chest PA and Lateral  Chest PA and Lateral   REASON FOR EXAM:    shortness of breath  COMMENTS:       PROCEDURE: DXR - DXR CHEST PA (OR AP) AND LATERAL  - Apr 17 2014 10:46AM     CLINICAL DATA:  Acute shortness of breath, and chest pain. History  of T6 compression fracture and CHF. End-stage renal disease.    EXAM:  CHEST  2 VIEW    COMPARISON:  03/15/2014    FINDINGS:  Increased cardiomegaly with vascular and interstitial prominence.  Developing small bilateral pleural effusions with basilar  atelectasis. Findings compatible with mild volume overload/ CHF.  Right IJ dialysis catheter tips SVC RA junction. Lower lung volumes  evident. Negative for pneumothorax. Atherosclerosis noted of the  aorta.    Degenerative changes of the spine with stable compression fracture  T12. However, thereis progression of the T6 compression fracture  with a vertebral plana deformity as well as a new adjacent T7  compression fracture. This results in increased kyphosis at this  level.     IMPRESSION:  Cardiomegaly with developing mild interstitial edema pattern and  pleural effusions compatible with volume overload versus early CHF.  Associated bibasilar atelectasis    Worsening T6 compression fracture and new adjacent T7 compression  fracture with increased thoracic kyphosis.    Stable T12 compression fracture.      Electronically Signed    By: Jerilynn Mages.  Shick M.D.    On: 04/17/2014 11:06         Verified By: Earl Gala, M.D.,    [phone] 06:34, Chest Portable Single View  Chest Portable Single View   REASON FOR EXAM:    CHF  COMMENTS:       PROCEDURE: DXR - DXR  PORTABLE CHEST SINGLE VIEW  - Apr 18 2014  6:34AM     CLINICAL DATA:  CHF. Chest and upper back pain. History of prior  myocardial infarction and hypertension.    EXAM:  PORTABLE CHEST - 1VIEW    COMPARISON:  04/17/2014; 03/15/2014; 02/16/2014    FINDINGS:  Grossly unchanged enlarged cardiac silhouette and mediastinal  contours with atherosclerotic plaque within thoracic aorta. Stable  position of support apparatus. Pulmonary vasculature appears  slightly less distinct than present examination with cephalization  of flow, worse within the right upper lung. Suspected minimal  increase in small bilateral effusions with associated bibasilar  opacities, left greater than right. No pneumothorax. Unchanged  bones.     IMPRESSION:  Suspected worsening asymmetric pulmonary edema, right greater than  left with minimal increase in trace bile effusions and associated  worsening bibasilar opacities, left greater than right, likely  atelectasis.    Electronically Signed    By: Sandi Mariscal M.D.    On: 04/18/2014 08:06         Verified By: Aileen Fass, M.D.,  Cardiology:    21-Feb-16 10:10, ED ECG  Ventricular Rate 82  Atrial Rate 82  P-R Interval 178  QRS Duration 154  QT 454  QTc 530  P Axis 46  R Axis -38  T Axis 59  ECG interpretation   Normal sinus rhythm  Left axis deviation  Left bundle branch block  Abnormal ECG  When compared with ECG of 15-Mar-2014 21:33,  Left bundle branch block is now Present  ----------unconfirmed----------  Confirmed by OVERREAD, NOT ([id number]), editor PEARSON, BARBARA ([id number]) on 04/18/2014 12:10:05 PM  ED ECG     21-Feb-16 17:07, Echo Doppler  Echo Doppler   REASON FOR EXAM:      COMMENTS:       PROCEDURE: [facility] - ECHO DOPPLER COMPLETE(TRANSTHOR)  - Apr 17 2014  5:07PM     RESULT: Echocardiogram Report    Patient Name:   [name] Date of Exam: 04/17/2014  Medical Rec #:  [id number]           Custom1:  Date of Birth:  April 18, 1923         Height:       66.0 in  Patient Age:    81 years         Weight:       102.0 lb  Patient Gender: F                BSA:          1.50 m??    Indications: CHF  Sonographer:    Arville Go RDCS  Referring Phys: Fulton Reek, D  Summary:   1. Left ventricular ejection fraction, by visual estimation, is 20 to   25%.   2. Severely decreased global left ventricular systolic function.   3. Moderately dilated left atrium.   4. Mildly dilated right atrium.   5. Moderate pleural effusion in the left lateral region.   6. Moderate mitral valve regurgitation.   7. Moderate thickening of the anterior and posterior mitral valve   leaflets.   8. Myxomatous mitral valve.   9. Mildly increased left ventricular internal cavity size.  10. Mild aortic valve stenosis.  11. Mild to moderate tricuspid regurgitation.  12. Mildly increased left ventricular posterior wall thickness.  13. Dilated cardiomyopathy.  2D AND M-MODE MEASUREMENTS (normal ranges within parentheses):  Left Ventricle:          Normal  IVSd (2D):      0.86 cm (0.7-1.1)  LVPWd (2D):     1.20 cm (0.7-1.1) Aorta/LA:                  Normal  LVIDd (2D):     5.21 cm (3.4-5.7) Aortic Root (2D): 2.70 cm (2.4-3.7)  LVIDs (2D):     4.60 cm           Left Atrium (2D): 5.00 cm (1.9-4.0)  LV FS (2D):     11.7 %   (>25%)  LV EF (2D):     25.2 %   (>50%)                                    Right Ventricle:                                    RVd (2D):  LV DIASTOLIC FUNCTION:  MV Peak E: 1.81 m/s  SPECTRAL DOPPLER ANALYSIS (where applicable):  Mitral Valve:  MV Max Vel:   1.86 m/s MV P1/2 Time: 45.00 msec  MV Mean Grad: 4.0 mmHg MV Area, PHT: 4.89 cm??  Aortic Valve: AoV Max Vel: 1.61 m/s AoV Peak PG: 10.4 mmHg AoV Mean PG:   5.0 mmHg  LVOT Vmax: 0.53 m/s LVOT VTI: 0.159 m LVOT Diameter: 1.90 cm  AoV Area, Vmax: 0.94 cm?? AoV Area, VTI: 1.42 cm?? AoV Area, Vmn: 1.42 cm??  Pulmonic Valve:  PV Max Velocity: 0.81 m/s PV Max PG: 2.7 mmHg PV  Mean PG:    PHYSICIAN INTERPRETATION:  Left Ventricle: The left ventricular internal cavity size was mildly   increased. LV septal wall thickness was normal. LV posterior wall   thickness was mildly increased. Global LV systolic function was severely     decreased. Left ventricular ejection fraction, by visual estimation, is   20 to 25%. Findings are consistent with dilated cardiomyopathy.  Right Ventricle: The right ventricular size is mildly enlarged. Global RV   systolic function is mildly reduced.  Left Atrium: The left atrium is moderately dilated.  Right Atrium: Theright atrium is mildly dilated.  Pericardium: There is no evidence of pericardial effusion. There is a   moderate pleural effusion in the left lateral region.  Mitral Valve: The mitral valve is myxomatous. There is moderate   thickening of the anterior and posterior mitral valve leaflets. Moderate   mitral valve regurgitation is seen.  Tricuspid Valve: The tricuspid valve is normal. Mild to moderate   tricuspid regurgitation is visualized.  Aortic  Valve: The aortic valve is normal. Mild aortic stenosis is   present. No evidence of aortic valve regurgitation is seen.  Pulmonic Valve: The pulmonic valve is normal. No indication of pulmonic   valve regurgitation.    [place] MD  Electronically signed by [place] MD  Signature Date/Time: 04/18/2014/8:59:45 AM    *** Final ***    IMPRESSION: .        Verified By: Yolonda Kida, M.D., MD   Assessment/Plan:  Assessment/Plan:  Assessment acute on chronic congestive heart failure  elevated troponin  chest pain  arteriosclerotic vascular disease   dementia   end-stage renal disease on dialysis  anemia of chronic disease   abnormal EKG, left bundle-branch block  hypoxemia  atrial fibrillation paroxysmal .   Plan recommend outpatient therapy  continue diuretic therapy for congestive heart failure  follow-up troponins possibly  demand ischemia  continue amiodarone for paroxysmal AFib  do not recommend long-term anticoagulation  continue  dialysis therapy I am concerned about dementia and mild confusion  continue medical therapy   Electronic Signatures: Lujean Amel D (MD)  (Signed 25-Feb-16 09:41)  Authored: Chief Complaint, VITAL SIGNS/ANCILLARY NOTES, Brief Assessment, Lab Results, Radiology Results, Assessment/Plan   Last Updated: 25-Feb-16 09:41 by Yolonda Kida (MD)

## 2014-06-26 NOTE — Consult Note (Signed)
Chief Complaint:  Subjective/Chief Complaint Dementia denies any shortness of breath but is not a reliable historian as seems to be resting comfortably in bed   VITAL SIGNS/ANCILLARY NOTES: **Vital Signs.:   23-Feb-16 07:44  Vital Signs Type Q 4hr  Celsius 36.6  Temperature Source oral  Pulse Pulse 71  Respirations Respirations 18  Systolic BP Systolic BP 945  Diastolic BP (mmHg) Diastolic BP (mmHg) 70  Mean BP 95  Pulse Ox % Pulse Ox % 94  Pulse Ox Activity Level  At rest  Oxygen Delivery 2L  *Intake and Output.:   Shift 23-Feb-16 07:00  Grand Totals Intake:  250 Output:  350    Net:  -100 24 Hr.:  -2300  Oral Intake      In:  250  Urine ml     Out:  350  Length of Stay Totals Intake:  250 Output:  2550    Net:  -2300   Brief Assessment:  GEN well developed, well nourished, no acute distress   Cardiac Regular  murmur present   Respiratory normal resp effort  clear BS   Gastrointestinal Normal   Gastrointestinal details normal Soft  Nontender  Nondistended  No masses palpable   EXTR negative cyanosis/clubbing, negative edema   Lab Results: Hepatic:  22-Feb-16 04:58   Albumin, Serum  2.5  Bilirubin, Total 0.3  Alkaline Phosphatase  148  SGPT (ALT)  109  SGOT (AST)  159  Total Protein, Serum  6.2    18:50   Albumin, Serum  2.4  Lab:  22-Feb-16 05:40   O2 Saturation (Pulse Ox) 91  FiO2 (Pulse Ox) 0.21 (Result(s) reported on 18 Apr 2014 at 05:52AM.)  Routine Chem:  22-Feb-16 04:58   Glucose, Serum 97  BUN  43  Creatinine (comp)  3.92  Sodium, Serum 140  Potassium, Serum 4.6  Chloride, Serum 101  CO2, Serum  34  Calcium (Total), Serum 8.7  Anion Gap  5  Osmolality (calc) 290  eGFR (African American)  14  eGFR (Non-African American)  11 (eGFR values <68mL/min/1.73 m2 may be an indication of chronic kidney disease (CKD). Calculated eGFR, using the MRDR Study equation, is useful in  patients with stable renal function. The eGFR calculation will not  be reliable in acutely ill patients when serum creatinine is changing rapidly. It is not useful in patients on dialysis. The eGFR calculation may not be applicable to patients at the low and high extremes of body sizes, pregnant women, and vegetarians.)    18:50   Glucose, Serum  106  BUN  41  Creatinine (comp)  4.22  Sodium, Serum 141  Potassium, Serum 4.4  Chloride, Serum 102  CO2, Serum  34  Calcium (Total), Serum  8.2  Phosphorus, Serum  5.2  Anion Gap  5  Osmolality (calc) 292  eGFR (African American)  13  eGFR (Non-African American)  11 (eGFR values <21mL/min/1.73 m2 may be an indication of chronic kidney disease (CKD). Calculated eGFR, using the MRDR Study equation, is useful in  patients with stable renal function. The eGFR calculation will not be reliable in acutely ill patients when serum creatinine is changing rapidly. It is not useful in patients on dialysis. The eGFR calculation may not be applicable to patients at the low and high extremes of body sizes, pregnant women, and vegetarians.)  Routine Hem:  22-Feb-16 04:58   WBC (CBC) 7.9  RBC (CBC)  3.47  Hemoglobin (CBC)  9.8  Hematocrit (CBC)  32.1  Platelet Count (CBC) 309  MCV 92  MCH 28.3  MCHC  30.6  RDW  16.9  Neutrophil % 77.6  Lymphocyte % 7.7  Monocyte % 10.9  Eosinophil % 3.0  Basophil % 0.8  Neutrophil # 6.1  Lymphocyte #  0.6  Monocyte # 0.9  Eosinophil # 0.2  Basophil # 0.1 (Result(s) reported on 18 Apr 2014 at [facility].)    17:47   WBC (CBC) 6.3  RBC (CBC)  3.24  Hemoglobin (CBC)  9.6  Hematocrit (CBC)  30.5  Platelet Count (CBC) 306  MCV 94  MCH 29.5  MCHC  31.4  RDW  18.4  Neutrophil % 72.7  Lymphocyte % 9.8  Monocyte % 11.9  Eosinophil % 4.8  Basophil % 0.8  Neutrophil # 4.6  Lymphocyte #  0.6  Monocyte # 0.7  Eosinophil # 0.3  Basophil # 0.0 (Result(s) reported on 18 Apr 2014 at 07:13PM.)   Radiology Results: XRay:    21-Feb-16 10:46, Chest PA and Lateral  Chest PA  and Lateral   REASON FOR EXAM:    shortness of breath  COMMENTS:       PROCEDURE: DXR - DXR CHEST PA (OR AP) AND LATERAL  - Apr 17 2014 10:46AM     CLINICAL DATA:  Acute shortness of breath, and chest pain. History  of T6 compression fracture and CHF. End-stage renal disease.    EXAM:  CHEST  2 VIEW    COMPARISON:  03/15/2014    FINDINGS:  Increased cardiomegaly with vascular and interstitial prominence.  Developing small bilateral pleural effusions with basilar  atelectasis. Findings compatible with mild volume overload/ CHF.  Right IJ dialysis catheter tips SVC RA junction. Lower lung volumes  evident. Negative for pneumothorax. Atherosclerosis noted of the  aorta.    Degenerative changes of the spine with stable compression fracture  T12. However, thereis progression of the T6 compression fracture  with a vertebral plana deformity as well as a new adjacent T7  compression fracture. This results in increased kyphosis at this  level.     IMPRESSION:  Cardiomegaly with developing mild interstitial edema pattern and  pleural effusions compatible with volume overload versus early CHF.  Associated bibasilar atelectasis    Worsening T6 compression fracture and new adjacent T7 compression  fracture with increased thoracic kyphosis.    Stable T12 compression fracture.      Electronically Signed    By: Jerilynn Mages.  Shick M.D.    On: 04/17/2014 11:06         Verified By: Earl Gala, M.D.,    [phone] 06:34, Chest Portable Single View  Chest Portable Single View   REASON FOR EXAM:    CHF  COMMENTS:       PROCEDURE: DXR - DXR PORTABLE CHEST SINGLE VIEW  - Apr 18 2014  6:34AM     CLINICAL DATA:  CHF. Chest and upper back pain. History of prior  myocardial infarction and hypertension.    EXAM:  PORTABLE CHEST - 1VIEW    COMPARISON:  04/17/2014; 03/15/2014; 02/16/2014    FINDINGS:  Grossly unchanged enlarged cardiac silhouette and mediastinal  contours with  atherosclerotic plaque within thoracic aorta. Stable  position of support apparatus. Pulmonary vasculature appears  slightly less distinct than present examination with cephalization  of flow, worse within the right upper lung. Suspected minimal  increase in small bilateral effusions with associated bibasilar  opacities, left greater than right. No pneumothorax. Unchanged  bones.     IMPRESSION:  Suspected worsening asymmetric pulmonary edema, right greater than  left with minimal increase in trace bile effusions and associated  worsening bibasilar opacities, left greater than right, likely  atelectasis.    Electronically Signed    By: Sandi Mariscal M.D.    On: 04/18/2014 08:06         Verified By: Aileen Fass, M.D.,  Cardiology:    21-Feb-16 10:10, ED ECG  Ventricular Rate 82  Atrial Rate 82  P-R Interval 178  QRS Duration 154  QT 454  QTc 530  P Axis 46  R Axis -38  T Axis 59  ECG interpretation   Normal sinus rhythm  Left axis deviation  Left bundle branch block  Abnormal ECG  When compared with ECG of 15-Mar-2014 21:33,  Left bundle branch block is now Present  ----------unconfirmed----------  Confirmed by OVERREAD, NOT ([id number]), editor PEARSON, BARBARA ([id number]) on 04/18/2014 12:10:05 PM  ED ECG     21-Feb-16 17:07, Echo Doppler  Echo Doppler   REASON FOR EXAM:      COMMENTS:       PROCEDURE: [facility] - ECHO DOPPLER COMPLETE(TRANSTHOR)  - Apr 17 2014  5:07PM     RESULT: Echocardiogram Report    Patient Name:   [name] Date of Exam: 04/17/2014  Medical Rec #:  [id number]           Custom1:  Date of Birth:  1923/07/26        Height:       66.0 in  Patient Age:    39 years         Weight:       102.0 lb  Patient Gender: F                BSA:          1.50 m??    Indications: CHF  Sonographer:    Arville Go RDCS  Referring Phys: Fulton Reek, D    Summary:   1. Left ventricular ejection fraction, by visual estimation, is 20 to   25%.   2. Severely  decreased global left ventricular systolic function.   3. Moderately dilated left atrium.   4. Mildly dilated right atrium.   5. Moderate pleural effusion in the left lateral region.   6. Moderate mitral valve regurgitation.   7. Moderate thickening of the anterior and posterior mitral valve   leaflets.   8. Myxomatous mitral valve.   9. Mildly increased left ventricular internal cavity size.  10. Mild aortic valve stenosis.  11. Mild to moderate tricuspid regurgitation.  12. Mildly increased left ventricular posterior wall thickness.  13. Dilated cardiomyopathy.  2D AND M-MODE MEASUREMENTS (normal ranges within parentheses):  Left Ventricle:          Normal  IVSd (2D):      0.86 cm (0.7-1.1)  LVPWd (2D):     1.20 cm (0.7-1.1) Aorta/LA:                  Normal  LVIDd (2D):     5.21 cm (3.4-5.7) Aortic Root (2D): 2.70 cm (2.4-3.7)  LVIDs (2D):     4.60 cm           Left Atrium (2D): 5.00 cm (1.9-4.0)  LV FS (2D):     11.7 %   (>25%)  LV EF (2D):     25.2 %   (>50%)  Right Ventricle:                                    RVd (2D):  LV DIASTOLIC FUNCTION:  MV Peak E: 1.81 m/s  SPECTRAL DOPPLER ANALYSIS (where applicable):  Mitral Valve:  MV Max Vel:   1.86 m/s MV P1/2 Time: 45.00 msec  MV Mean Grad: 4.0 mmHg MV Area, PHT: 4.89 cm??  Aortic Valve: AoV Max Vel: 1.61 m/s AoV Peak PG: 10.4 mmHg AoV Mean PG:   5.0 mmHg  LVOT Vmax: 0.53 m/s LVOT VTI: 0.159 m LVOT Diameter: 1.90 cm  AoV Area, Vmax: 0.94 cm?? AoV Area, VTI: 1.42 cm?? AoV Area, Vmn: 1.42 cm??  Pulmonic Valve:  PV Max Velocity: 0.81 m/s PV Max PG: 2.7 mmHg PV Mean PG:    PHYSICIAN INTERPRETATION:  Left Ventricle: The left ventricular internal cavity size was mildly   increased. LV septal wall thickness was normal. LV posterior wall   thickness was mildly increased. Global LV systolic function was severely     decreased. Left ventricular ejection fraction, by visual estimation, is   20 to  25%. Findings are consistent with dilated cardiomyopathy.  Right Ventricle: The right ventricular size is mildly enlarged. Global RV   systolic function is mildly reduced.  Left Atrium: The left atrium is moderately dilated.  Right Atrium: Theright atrium is mildly dilated.  Pericardium: There is no evidence of pericardial effusion. There is a   moderate pleural effusion in the left lateral region.  Mitral Valve: The mitral valve is myxomatous. There is moderate   thickening of the anterior and posterior mitral valve leaflets. Moderate   mitral valve regurgitation is seen.  Tricuspid Valve: The tricuspid valve is normal. Mild to moderate   tricuspid regurgitation is visualized.  Aortic Valve: The aortic valve is normal. Mild aortic stenosis is   present. No evidence of aortic valve regurgitation is seen.  Pulmonic Valve: The pulmonic valve is normal. No indication of pulmonic   valve regurgitation.    [place] MD  Electronically signed by [place] MD  Signature Date/Time: 04/18/2014/8:59:45 AM    *** Final ***    IMPRESSION: .        Verified By: Yolonda Kida, M.D., MD   Assessment/Plan:  Assessment/Plan:  Assessment acute on chronic congestive heart failure  elevated troponin  chest pain  arteriosclerotic vascular disease   dementia   end-stage renal disease on dialysis  anemia of chronic disease   abnormal EKG, left bundle-branch block  hypoxemia  atrial fibrillation paroxysmal .   Plan continue telemetry   continue diuretic therapy for congestive heart failure  follow-up troponins possibly demand ischemia  continue amiodarone for paroxysmal AFib  do not recommend long-term anticoagulation  continue  dialysis therapy  consider echocardiogram for congestive heart failure atrial fibrillation  I am concerned about dementia and mild confusion  continue medical therapy   Electronic Signatures: Lujean Amel D (MD)  (Signed 23-Feb-16  11:25)  Authored: Chief Complaint, VITAL SIGNS/ANCILLARY NOTES, Brief Assessment, Lab Results, Radiology Results, Assessment/Plan   Last Updated: 23-Feb-16 11:25 by Lujean Amel D (MD)

## 2014-09-23 ENCOUNTER — Other Ambulatory Visit: Payer: Self-pay | Admitting: *Deleted

## 2014-09-23 DIAGNOSIS — R911 Solitary pulmonary nodule: Secondary | ICD-10-CM

## 2014-09-27 ENCOUNTER — Inpatient Hospital Stay: Payer: Self-pay | Admitting: Oncology

## 2014-09-27 ENCOUNTER — Inpatient Hospital Stay: Payer: Self-pay | Attending: Oncology

## 2014-10-05 ENCOUNTER — Ambulatory Visit: Payer: Self-pay | Admitting: Oncology

## 2014-10-05 ENCOUNTER — Other Ambulatory Visit: Payer: Self-pay

## 2015-05-22 IMAGING — CR DG CHEST 1V PORT
1 series · 1 of 1 positions shown · non-contrast
Comparison: 01/10/2014

CLINICAL DATA: Sudden onset midsternal chest pain. Dialysis
patient. Previous history of MI x2 and hypertension.

EXAM:
PORTABLE CHEST - 1 VIEW

[ap]
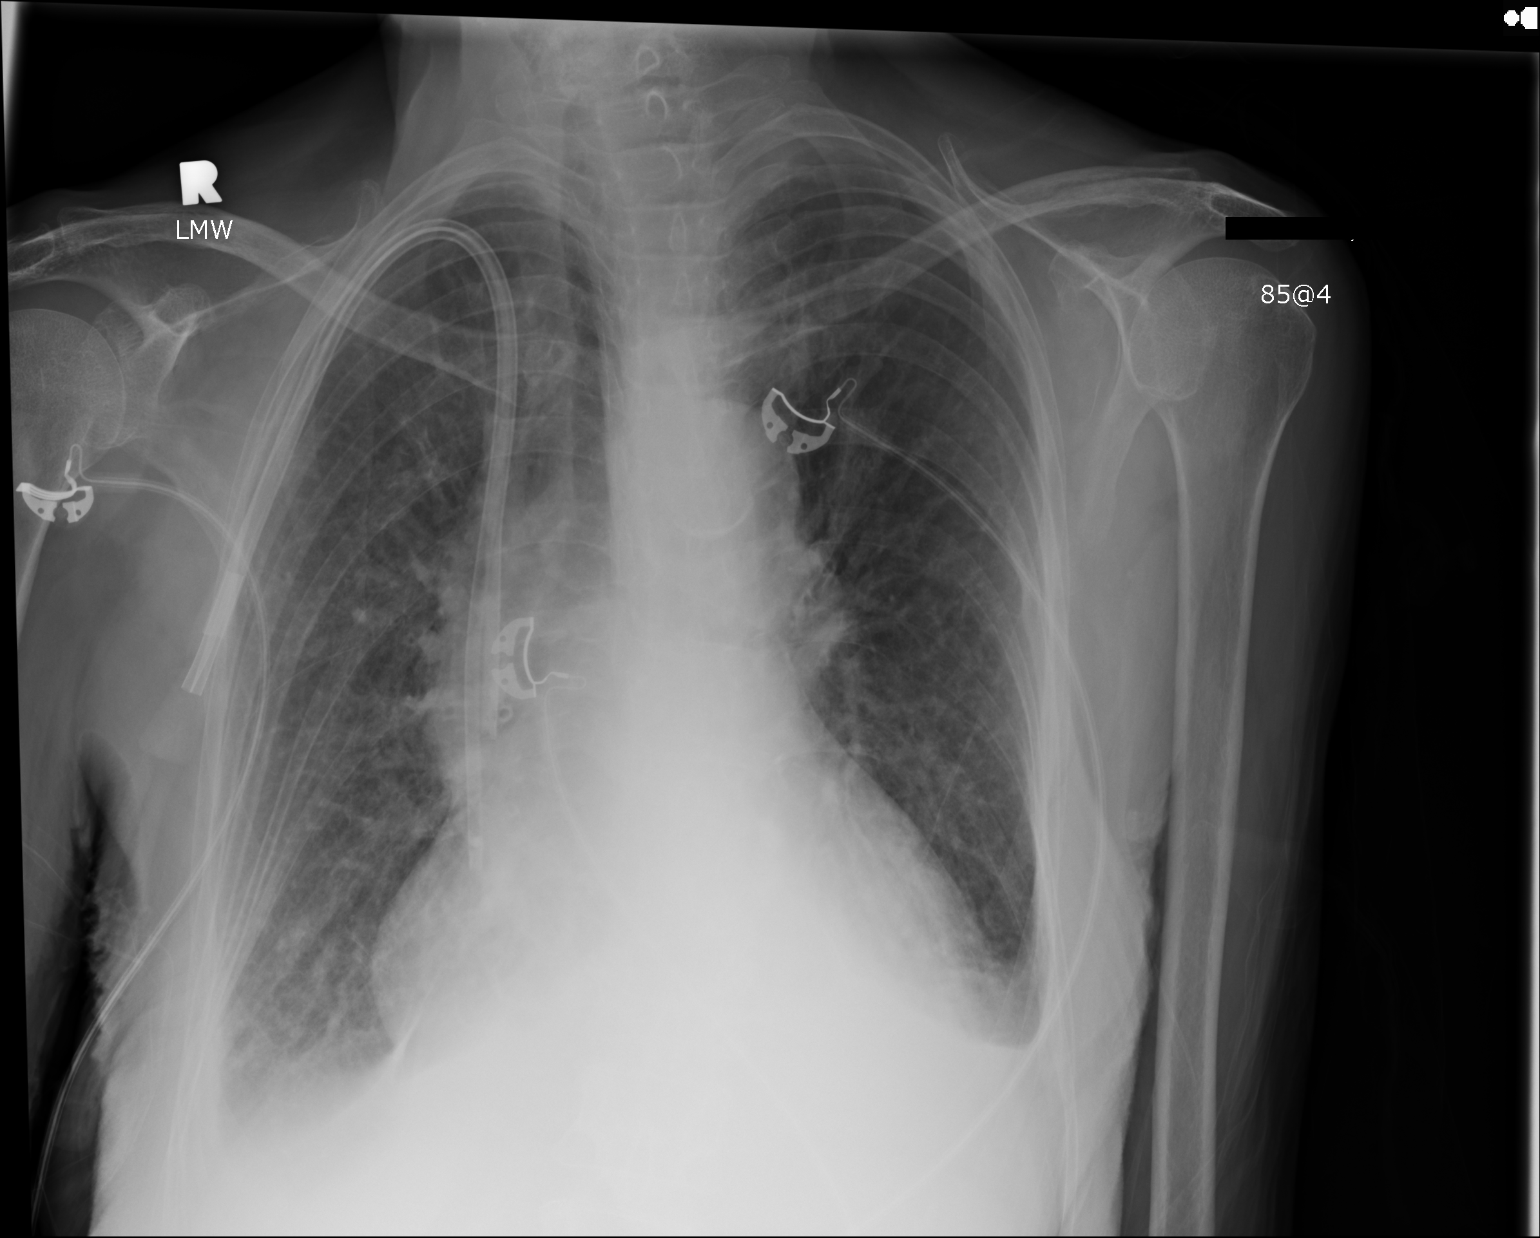

[1 of 1 positions shown; findings below may reference images not displayed]

FINDINGS: Right central venous dialysis catheter with split lead tips over the
lower SVC and cavoatrial junction. Cardiac enlargement with mild
vascular congestion. Small bilateral pleural effusions. No focal
airspace disease or consolidation. Calcification of the aorta.
IMPRESSION: Cardiac enlargement with mild vascular congestion and small
bilateral pleural effusions.

## 2015-07-02 IMAGING — CT CT ABD-PELV W/O CM
1 of 4 series · 4 of 46 positions shown, 9 images · non-contrast
Comparison: 01/30/2014

CLINICAL DATA: Right flank pain.  History of calculi

EXAM:
CT ABDOMEN AND PELVIS WITHOUT CONTRAST
TECHNIQUE: Multidetector CT imaging of the abdomen and pelvis was performed
following the standard protocol without IV contrast.

[Series 4: lung windows · axial · 0.61mm/px · z∈[-68,-24]mm · 4 of 16 slices shown, 9 images]
[im 4/16  soft-tissue]
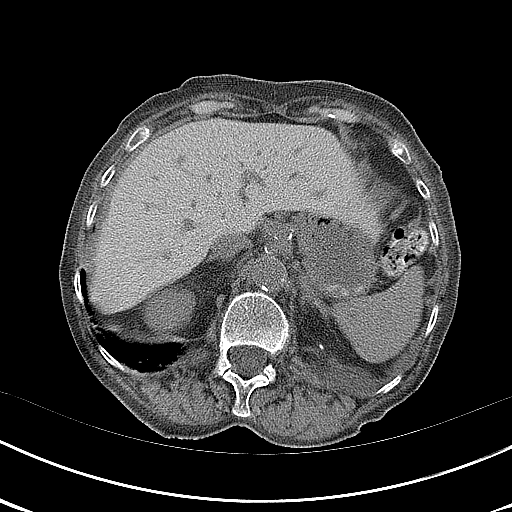
[im 4/16  lung]
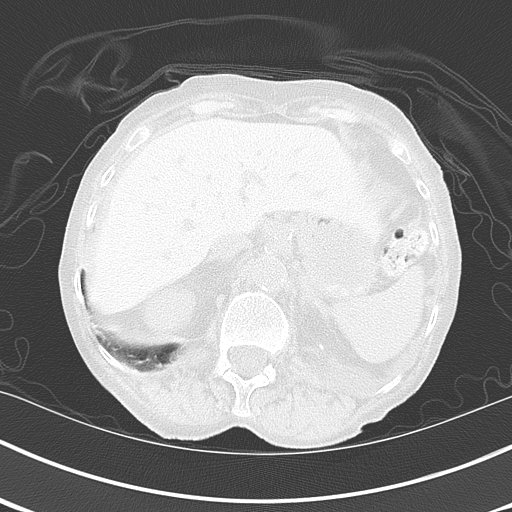
[im 4/16  bone]
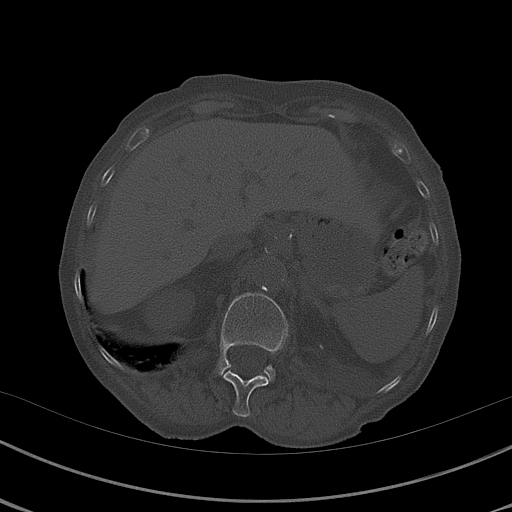
[im 7/16  soft-tissue]
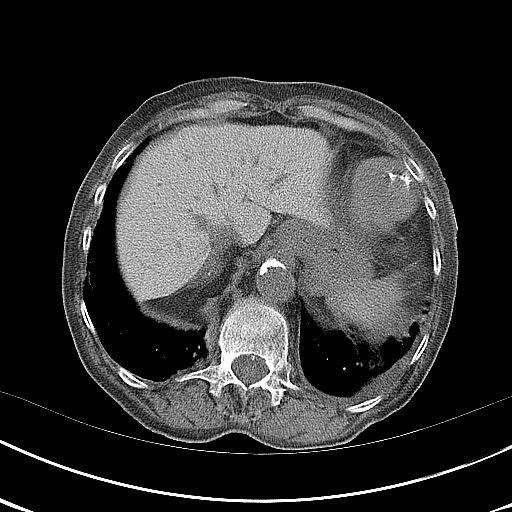
[im 7/16  lung]
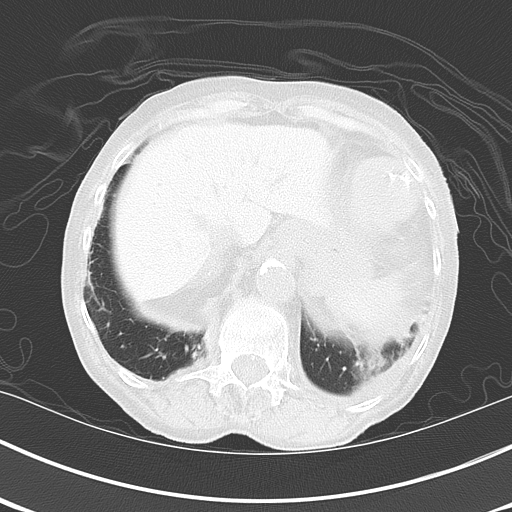
[im 10/16  soft-tissue]
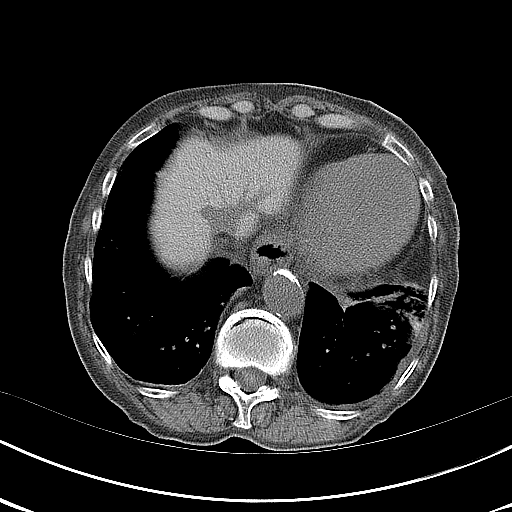
[im 10/16  lung]
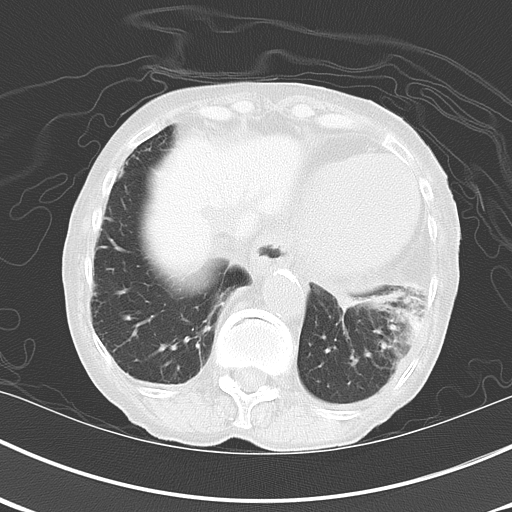
[im 13/16  soft-tissue]
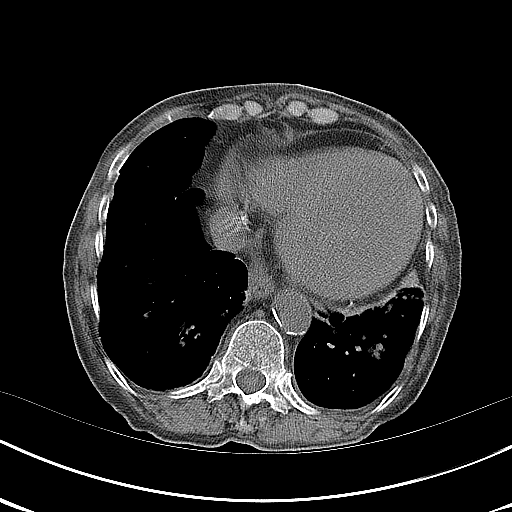
[im 13/16  lung]
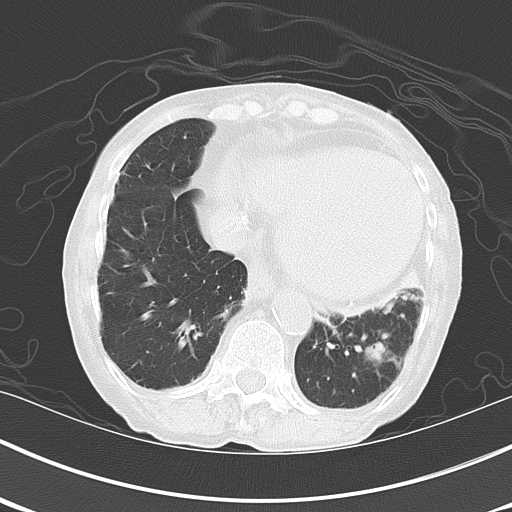

[4 of 46 positions shown; findings below may reference images not displayed]

FINDINGS: BODY WALL: Unremarkable.

LOWER CHEST: Chronic cardiomegaly. There is a small left pleural
effusion with mild basilar atelectasis.

Lobulated nodule in the left lower lobe measuring 17 mm. This has
grown since 5300.

ABDOMEN/PELVIS:

Liver: Dense appearance of the liver. Given the cardiomegaly,
question amiodarone changes.

Biliary: Cholelithiasis.  No evidence of acute cholecystitis

Pancreas: Unremarkable.

Spleen: Unremarkable.

Adrenals: Unremarkable.

Kidneys and ureters: Bilateral renal hilar calcifications, most
likely arterial. No hydronephrosis or ureteral calculus. There is a
chronic hemorrhagic cyst within the upper right kidney. Renal
atrophy, more extensive on the right.

Bladder: Moderately distended.

Reproductive: Hysterectomy.

Bowel: No obstruction. Colonic diverticulosis without active
inflammation.

Retroperitoneum: No mass or adenopathy.

Peritoneum: No ascites or pneumoperitoneum.

Vascular: No acute abnormality.

OSSEOUS: Left hip arthroplasty. No adverse findings. Remote T12
compression fracture with moderate height loss and mild
retropulsion. Advanced degenerative disc and facet arthropathy at
L4-5 with grade 1 slip.
IMPRESSION: 1. No hydronephrosis or ureteral calculus.
2. Cholelithiasis without evidence of acute cholecystitis.
3. 17 mm nodule in the left lower lobe which has enlarged from 5300.
This likely reflects a bronchogenic carcinoma, PET-CT and/or biopsy
recommended.
